# Patient Record
Sex: Female | Born: 1950 | Race: White | Hispanic: No | Marital: Married | Smoking: Never smoker
Health system: Southern US, Community
[De-identification: ages and names within clinical notes are randomized; demographics above are authoritative.]

## PROBLEM LIST (undated history)

## (undated) ENCOUNTER — Emergency Department (HOSPITAL_COMMUNITY): Admission: EM | Payer: Medicare Other | Source: Home / Self Care

## (undated) DIAGNOSIS — F419 Anxiety disorder, unspecified: Secondary | ICD-10-CM

## (undated) DIAGNOSIS — M5030 Other cervical disc degeneration, unspecified cervical region: Secondary | ICD-10-CM

## (undated) DIAGNOSIS — J45909 Unspecified asthma, uncomplicated: Secondary | ICD-10-CM

## (undated) DIAGNOSIS — F329 Major depressive disorder, single episode, unspecified: Secondary | ICD-10-CM

## (undated) DIAGNOSIS — Z8614 Personal history of Methicillin resistant Staphylococcus aureus infection: Secondary | ICD-10-CM

## (undated) DIAGNOSIS — IMO0002 Reserved for concepts with insufficient information to code with codable children: Secondary | ICD-10-CM

## (undated) DIAGNOSIS — I219 Acute myocardial infarction, unspecified: Secondary | ICD-10-CM

## (undated) DIAGNOSIS — M797 Fibromyalgia: Secondary | ICD-10-CM

## (undated) DIAGNOSIS — M329 Systemic lupus erythematosus, unspecified: Secondary | ICD-10-CM

## (undated) DIAGNOSIS — K219 Gastro-esophageal reflux disease without esophagitis: Secondary | ICD-10-CM

## (undated) DIAGNOSIS — F32A Depression, unspecified: Secondary | ICD-10-CM

## (undated) DIAGNOSIS — M51369 Other intervertebral disc degeneration, lumbar region without mention of lumbar back pain or lower extremity pain: Secondary | ICD-10-CM

## (undated) DIAGNOSIS — M5136 Other intervertebral disc degeneration, lumbar region: Secondary | ICD-10-CM

## (undated) DIAGNOSIS — Z9884 Bariatric surgery status: Secondary | ICD-10-CM

## (undated) DIAGNOSIS — Z973 Presence of spectacles and contact lenses: Secondary | ICD-10-CM

## (undated) DIAGNOSIS — I82409 Acute embolism and thrombosis of unspecified deep veins of unspecified lower extremity: Secondary | ICD-10-CM

## (undated) DIAGNOSIS — S83272A Complex tear of lateral meniscus, current injury, left knee, initial encounter: Secondary | ICD-10-CM

## (undated) DIAGNOSIS — E039 Hypothyroidism, unspecified: Secondary | ICD-10-CM

## (undated) DIAGNOSIS — I10 Essential (primary) hypertension: Secondary | ICD-10-CM

## (undated) DIAGNOSIS — D759 Disease of blood and blood-forming organs, unspecified: Secondary | ICD-10-CM

## (undated) DIAGNOSIS — M1990 Unspecified osteoarthritis, unspecified site: Secondary | ICD-10-CM

## (undated) DIAGNOSIS — Z8719 Personal history of other diseases of the digestive system: Secondary | ICD-10-CM

## (undated) DIAGNOSIS — I499 Cardiac arrhythmia, unspecified: Secondary | ICD-10-CM

## (undated) DIAGNOSIS — R0602 Shortness of breath: Secondary | ICD-10-CM

## (undated) DIAGNOSIS — S0990XA Unspecified injury of head, initial encounter: Secondary | ICD-10-CM

## (undated) DIAGNOSIS — H9190 Unspecified hearing loss, unspecified ear: Secondary | ICD-10-CM

## (undated) DIAGNOSIS — L0291 Cutaneous abscess, unspecified: Secondary | ICD-10-CM

## (undated) DIAGNOSIS — I2510 Atherosclerotic heart disease of native coronary artery without angina pectoris: Secondary | ICD-10-CM

## (undated) DIAGNOSIS — R51 Headache: Secondary | ICD-10-CM

## (undated) DIAGNOSIS — M419 Scoliosis, unspecified: Secondary | ICD-10-CM

## (undated) HISTORY — DX: Complex tear of lateral meniscus, current injury, left knee, initial encounter: S83.272A

## (undated) HISTORY — DX: Personal history of Methicillin resistant Staphylococcus aureus infection: Z86.14

---
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
## 1984-04-09 HISTORY — PX: ABDOMINAL HYSTERECTOMY: SHX81

## 1998-07-21 ENCOUNTER — Encounter: Admission: RE | Admit: 1998-07-21 | Discharge: 1998-10-19 | Payer: Self-pay | Admitting: Internal Medicine

## 2008-04-09 HISTORY — PX: CARDIAC CATHETERIZATION: SHX172

## 2008-04-09 HISTORY — PX: GASTRIC BYPASS: SHX52

## 2010-08-08 ENCOUNTER — Encounter: Payer: Self-pay | Admitting: Cardiology

## 2010-09-22 ENCOUNTER — Encounter: Payer: Self-pay | Admitting: Cardiology

## 2010-12-19 ENCOUNTER — Telehealth: Payer: Self-pay | Admitting: Cardiology

## 2010-12-19 NOTE — Telephone Encounter (Signed)
Pt's orthopedic surgeon will be doing knee surgery, will not do it until she has a filter placed in her artery, she said no one will touch her but stuckey, she has not been seen in 12 yrs, said to tell him she is robert walker's granddaughter, pls call

## 2010-12-19 NOTE — Telephone Encounter (Signed)
Pt has a tear in her left knee that needs to be repaired.  She states she has factors 5,9,11 and 13.  Her orthopedic physician told her that she needs to have a filter put in before they can operate.  Pt is requesting Dr Riley Kill for this as he has treated her in the past as well as many members of her family.  The date for the surgery cannot be scheduled until she is cleared by Dr Riley Kill.

## 2011-01-01 ENCOUNTER — Telehealth: Payer: Self-pay | Admitting: Cardiology

## 2011-01-01 NOTE — Telephone Encounter (Signed)
I spoke with the pt and made her aware that we are pulling her old chart from warehouse.  I recommended that the pt obtain her records from [facility] and [facility].  Will await records before obtaining appointment. Dr Riley Kill would like to review records.   Pt's dad Jeronimo Greaves and grandfather Kym Groom

## 2011-01-01 NOTE — Telephone Encounter (Signed)
Please call about clotting problems and procedure she needs

## 2011-01-04 ENCOUNTER — Telehealth: Payer: Self-pay | Admitting: Cardiology

## 2011-01-04 NOTE — Telephone Encounter (Signed)
I spoke with the pt and she is coming into the office tomorrow to sign a release of information for records.

## 2011-01-04 NOTE — Telephone Encounter (Signed)
I will close this encounter.  The pt has a new telephone note from 01/04/11.

## 2011-01-04 NOTE — Telephone Encounter (Signed)
I spoke with our medical records department and they are once again trying to find this pt's old chart for Dr Riley Kill to review.  I called the pt but her husband said that she is currently in the store.  I will call the pt back.   Melanie Gutting, RN 01/01/2011 5:07 PM Signed  I spoke with the pt and made her aware that we are pulling her old chart from warehouse. I recommended that the pt obtain her records from [facility] and [facility]. Will await records before obtaining appointment. Dr Riley Kill would like to review records.  Pt's dad Jeronimo Greaves and grandfather Kym Groom

## 2011-01-04 NOTE — Telephone Encounter (Signed)
Please see other telephone notes since this message.

## 2011-01-04 NOTE — Telephone Encounter (Signed)
Pt is calling about records [facility], [facility] and [facility] please call

## 2011-01-30 ENCOUNTER — Telehealth: Payer: Self-pay | Admitting: Cardiology

## 2011-01-30 NOTE — Telephone Encounter (Signed)
Left a message to call back.

## 2011-01-30 NOTE — Telephone Encounter (Signed)
Pt returning call to nurse regarding getting some info from other two hospitals where pt has had heart attacks. Md will not approve knee surgery until Dr. Riley Kill get back with pt. Please return call to discuss further.

## 2011-02-23 NOTE — Telephone Encounter (Signed)
I spoke with the pt and made her aware that Dr Riley Kill recommended that she see her Hematologist first for evaluation in preparation for surgery.  The pt said that she is scheduled to go to the [facility] in January.  The pt said she will see GI, Hematology and have a Cardiology work-up at that time.  The pt's last cardiac cath took place at the [facility].  After this evaluation the pt will contact Dr Sherene Sires office if she is cleared from a cardiac standpoint by the [facility].  When the pt schedules her knee surgery with Dr Thurston Hole he would like Dr Riley Kill to be available if needed.  I made the pt aware that Dr Riley Kill will need to then see her in the office to put "all the pieces together" so that he is familiar with her history before she has surgery.  The pt agrees with plan and will contact our office after her evaluation at the [facility].

## 2011-07-27 ENCOUNTER — Encounter: Payer: Self-pay | Admitting: Cardiology

## 2011-08-30 ENCOUNTER — Encounter: Payer: Self-pay | Admitting: Cardiology

## 2012-08-08 ENCOUNTER — Encounter (HOSPITAL_BASED_OUTPATIENT_CLINIC_OR_DEPARTMENT_OTHER): Payer: Self-pay | Admitting: *Deleted

## 2012-08-08 NOTE — Progress Notes (Signed)
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

## 2012-08-11 ENCOUNTER — Encounter (HOSPITAL_BASED_OUTPATIENT_CLINIC_OR_DEPARTMENT_OTHER): Payer: Self-pay | Admitting: *Deleted

## 2012-08-11 NOTE — Progress Notes (Signed)
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  At about 2 pm dr Thurston Hole talked with dr Jean Rosenthal and pt put back on sch here for tomorrow-pt will do what dr Rudene Christians will bring all her meds and keep extra lovenox will bring. Will need istat and ekg-has not seen her cardiologist [facility] in 1 yr.

## 2012-08-12 ENCOUNTER — Encounter (HOSPITAL_BASED_OUTPATIENT_CLINIC_OR_DEPARTMENT_OTHER)
Admission: RE | Disposition: A | Discharge status: Home / Self Care | Payer: Self-pay | Source: Ambulatory Visit | Attending: Orthopedic Surgery

## 2012-08-12 ENCOUNTER — Encounter (HOSPITAL_BASED_OUTPATIENT_CLINIC_OR_DEPARTMENT_OTHER): Payer: Self-pay | Admitting: Anesthesiology

## 2012-08-12 ENCOUNTER — Ambulatory Visit (HOSPITAL_BASED_OUTPATIENT_CLINIC_OR_DEPARTMENT_OTHER): Admission: RE | Admit: 2012-08-12 | Payer: Medicare Other | Source: Ambulatory Visit | Admitting: Orthopedic Surgery

## 2012-08-12 ENCOUNTER — Encounter (HOSPITAL_BASED_OUTPATIENT_CLINIC_OR_DEPARTMENT_OTHER): Admission: RE | Payer: Self-pay | Source: Ambulatory Visit

## 2012-08-12 ENCOUNTER — Ambulatory Visit (HOSPITAL_BASED_OUTPATIENT_CLINIC_OR_DEPARTMENT_OTHER)
Admission: RE | Admit: 2012-08-12 | Discharge: 2012-08-12 | Disposition: A | Discharge status: Home / Self Care | Payer: Medicare Other | Source: Ambulatory Visit | Attending: Orthopedic Surgery | Admitting: Orthopedic Surgery

## 2012-08-12 ENCOUNTER — Ambulatory Visit (HOSPITAL_BASED_OUTPATIENT_CLINIC_OR_DEPARTMENT_OTHER): Payer: Medicare Other | Admitting: Anesthesiology

## 2012-08-12 ENCOUNTER — Encounter: Payer: Self-pay | Admitting: Physician Assistant

## 2012-08-12 ENCOUNTER — Other Ambulatory Visit: Payer: Self-pay | Admitting: Physician Assistant

## 2012-08-12 DIAGNOSIS — H9190 Unspecified hearing loss, unspecified ear: Secondary | ICD-10-CM | POA: Insufficient documentation

## 2012-08-12 DIAGNOSIS — I2510 Atherosclerotic heart disease of native coronary artery without angina pectoris: Secondary | ICD-10-CM | POA: Insufficient documentation

## 2012-08-12 DIAGNOSIS — Z9884 Bariatric surgery status: Secondary | ICD-10-CM | POA: Insufficient documentation

## 2012-08-12 DIAGNOSIS — M51379 Other intervertebral disc degeneration, lumbosacral region without mention of lumbar back pain or lower extremity pain: Secondary | ICD-10-CM | POA: Insufficient documentation

## 2012-08-12 DIAGNOSIS — M5030 Other cervical disc degeneration, unspecified cervical region: Secondary | ICD-10-CM | POA: Insufficient documentation

## 2012-08-12 DIAGNOSIS — M5136 Other intervertebral disc degeneration, lumbar region: Secondary | ICD-10-CM | POA: Insufficient documentation

## 2012-08-12 DIAGNOSIS — Z881 Allergy status to other antibiotic agents status: Secondary | ICD-10-CM | POA: Insufficient documentation

## 2012-08-12 DIAGNOSIS — IMO0002 Reserved for concepts with insufficient information to code with codable children: Secondary | ICD-10-CM | POA: Insufficient documentation

## 2012-08-12 DIAGNOSIS — M1990 Unspecified osteoarthritis, unspecified site: Secondary | ICD-10-CM | POA: Insufficient documentation

## 2012-08-12 DIAGNOSIS — D689 Coagulation defect, unspecified: Secondary | ICD-10-CM | POA: Insufficient documentation

## 2012-08-12 DIAGNOSIS — R29898 Other symptoms and signs involving the musculoskeletal system: Secondary | ICD-10-CM | POA: Insufficient documentation

## 2012-08-12 DIAGNOSIS — Z79899 Other long term (current) drug therapy: Secondary | ICD-10-CM | POA: Insufficient documentation

## 2012-08-12 DIAGNOSIS — I498 Other specified cardiac arrhythmias: Secondary | ICD-10-CM | POA: Insufficient documentation

## 2012-08-12 DIAGNOSIS — S0990XA Unspecified injury of head, initial encounter: Secondary | ICD-10-CM | POA: Insufficient documentation

## 2012-08-12 DIAGNOSIS — M419 Scoliosis, unspecified: Secondary | ICD-10-CM | POA: Insufficient documentation

## 2012-08-12 DIAGNOSIS — Z8614 Personal history of Methicillin resistant Staphylococcus aureus infection: Secondary | ICD-10-CM | POA: Insufficient documentation

## 2012-08-12 DIAGNOSIS — S83272A Complex tear of lateral meniscus, current injury, left knee, initial encounter: Secondary | ICD-10-CM | POA: Insufficient documentation

## 2012-08-12 DIAGNOSIS — S83272D Complex tear of lateral meniscus, current injury, left knee, subsequent encounter: Secondary | ICD-10-CM

## 2012-08-12 DIAGNOSIS — Z886 Allergy status to analgesic agent status: Secondary | ICD-10-CM | POA: Insufficient documentation

## 2012-08-12 DIAGNOSIS — Z973 Presence of spectacles and contact lenses: Secondary | ICD-10-CM | POA: Insufficient documentation

## 2012-08-12 DIAGNOSIS — F329 Major depressive disorder, single episode, unspecified: Secondary | ICD-10-CM | POA: Insufficient documentation

## 2012-08-12 DIAGNOSIS — E039 Hypothyroidism, unspecified: Secondary | ICD-10-CM | POA: Insufficient documentation

## 2012-08-12 DIAGNOSIS — M5137 Other intervertebral disc degeneration, lumbosacral region: Secondary | ICD-10-CM | POA: Insufficient documentation

## 2012-08-12 DIAGNOSIS — M329 Systemic lupus erythematosus, unspecified: Secondary | ICD-10-CM | POA: Insufficient documentation

## 2012-08-12 DIAGNOSIS — I499 Cardiac arrhythmia, unspecified: Secondary | ICD-10-CM | POA: Insufficient documentation

## 2012-08-12 DIAGNOSIS — R51 Headache: Secondary | ICD-10-CM | POA: Insufficient documentation

## 2012-08-12 DIAGNOSIS — I10 Essential (primary) hypertension: Secondary | ICD-10-CM | POA: Insufficient documentation

## 2012-08-12 DIAGNOSIS — X58XXXA Exposure to other specified factors, initial encounter: Secondary | ICD-10-CM | POA: Insufficient documentation

## 2012-08-12 DIAGNOSIS — K219 Gastro-esophageal reflux disease without esophagitis: Secondary | ICD-10-CM | POA: Insufficient documentation

## 2012-08-12 DIAGNOSIS — Z888 Allergy status to other drugs, medicaments and biological substances status: Secondary | ICD-10-CM | POA: Insufficient documentation

## 2012-08-12 DIAGNOSIS — L0291 Cutaneous abscess, unspecified: Secondary | ICD-10-CM | POA: Insufficient documentation

## 2012-08-12 DIAGNOSIS — I252 Old myocardial infarction: Secondary | ICD-10-CM | POA: Insufficient documentation

## 2012-08-12 DIAGNOSIS — Z8719 Personal history of other diseases of the digestive system: Secondary | ICD-10-CM | POA: Insufficient documentation

## 2012-08-12 DIAGNOSIS — I219 Acute myocardial infarction, unspecified: Secondary | ICD-10-CM | POA: Insufficient documentation

## 2012-08-12 DIAGNOSIS — J45909 Unspecified asthma, uncomplicated: Secondary | ICD-10-CM | POA: Insufficient documentation

## 2012-08-12 DIAGNOSIS — M2240 Chondromalacia patellae, unspecified knee: Secondary | ICD-10-CM | POA: Insufficient documentation

## 2012-08-12 DIAGNOSIS — R519 Headache, unspecified: Secondary | ICD-10-CM | POA: Insufficient documentation

## 2012-08-12 DIAGNOSIS — S83289A Other tear of lateral meniscus, current injury, unspecified knee, initial encounter: Secondary | ICD-10-CM | POA: Insufficient documentation

## 2012-08-12 DIAGNOSIS — I82409 Acute embolism and thrombosis of unspecified deep veins of unspecified lower extremity: Secondary | ICD-10-CM | POA: Insufficient documentation

## 2012-08-12 DIAGNOSIS — IMO0001 Reserved for inherently not codable concepts without codable children: Secondary | ICD-10-CM | POA: Insufficient documentation

## 2012-08-12 DIAGNOSIS — D759 Disease of blood and blood-forming organs, unspecified: Secondary | ICD-10-CM | POA: Insufficient documentation

## 2012-08-12 DIAGNOSIS — M797 Fibromyalgia: Secondary | ICD-10-CM | POA: Insufficient documentation

## 2012-08-12 HISTORY — PX: KNEE ARTHROSCOPY: SHX127

## 2012-08-12 HISTORY — DX: Unspecified hearing loss, unspecified ear: H91.90

## 2012-08-12 HISTORY — DX: Scoliosis, unspecified: M41.9

## 2012-08-12 HISTORY — DX: Personal history of other diseases of the digestive system: Z87.19

## 2012-08-12 HISTORY — DX: Reserved for concepts with insufficient information to code with codable children: IMO0002

## 2012-08-12 HISTORY — DX: Major depressive disorder, single episode, unspecified: F32.9

## 2012-08-12 HISTORY — DX: Disease of blood and blood-forming organs, unspecified: D75.9

## 2012-08-12 HISTORY — DX: Unspecified injury of head, initial encounter: S09.90XA

## 2012-08-12 HISTORY — DX: Cutaneous abscess, unspecified: L02.91

## 2012-08-12 HISTORY — DX: Anxiety disorder, unspecified: F41.9

## 2012-08-12 HISTORY — DX: Acute embolism and thrombosis of unspecified deep veins of unspecified lower extremity: I82.409

## 2012-08-12 HISTORY — DX: Essential (primary) hypertension: I10

## 2012-08-12 HISTORY — DX: Shortness of breath: R06.02

## 2012-08-12 HISTORY — DX: Unspecified asthma, uncomplicated: J45.909

## 2012-08-12 HISTORY — DX: Depression, unspecified: F32.A

## 2012-08-12 HISTORY — DX: Bariatric surgery status: Z98.84

## 2012-08-12 HISTORY — DX: Cardiac arrhythmia, unspecified: I49.9

## 2012-08-12 HISTORY — DX: Systemic lupus erythematosus, unspecified: M32.9

## 2012-08-12 HISTORY — DX: Other intervertebral disc degeneration, lumbar region without mention of lumbar back pain or lower extremity pain: M51.369

## 2012-08-12 HISTORY — DX: Atherosclerotic heart disease of native coronary artery without angina pectoris: I25.10

## 2012-08-12 HISTORY — DX: Other intervertebral disc degeneration, lumbar region: M51.36

## 2012-08-12 HISTORY — DX: Presence of spectacles and contact lenses: Z97.3

## 2012-08-12 HISTORY — DX: Other cervical disc degeneration, unspecified cervical region: M50.30

## 2012-08-12 HISTORY — DX: Fibromyalgia: M79.7

## 2012-08-12 HISTORY — DX: Unspecified osteoarthritis, unspecified site: M19.90

## 2012-08-12 HISTORY — DX: Gastro-esophageal reflux disease without esophagitis: K21.9

## 2012-08-12 HISTORY — DX: Headache: R51

## 2012-08-12 HISTORY — DX: Hypothyroidism, unspecified: E03.9

## 2012-08-12 HISTORY — DX: Acute myocardial infarction, unspecified: I21.9

## 2012-08-12 LAB — POCT I-STAT, CHEM 8
BUN: 6 mg/dL (ref 6–23)
Calcium, Ion: 1.32 mmol/L — ABNORMAL HIGH (ref 1.13–1.30)
Chloride: 105 mEq/L (ref 96–112)
HCT: 44 % (ref 36.0–46.0)
Sodium: 144 mEq/L (ref 135–145)

## 2012-08-12 SURGERY — ARTHROSCOPY, KNEE
Anesthesia: General | Site: Knee | Laterality: Left | Wound class: Clean

## 2012-08-12 SURGERY — ARTHROSCOPY, KNEE, WITH MEDIAL MENISCECTOMY
Anesthesia: Choice | Site: Knee | Laterality: Left

## 2012-08-12 MED ORDER — OXYCODONE-ACETAMINOPHEN 5-325 MG PO TABS: ORAL_TABLET | ORAL | Status: DC

## 2012-08-12 MED ORDER — ENOXAPARIN SODIUM 30 MG/0.3ML ~~LOC~~ SOLN: 30.0000 mg | Freq: Two times a day (BID) | SUBCUTANEOUS | Status: DC

## 2012-08-12 MED ORDER — OXYCODONE HCL 5 MG/5ML PO SOLN: 5.0000 mg | Freq: Once | ORAL | Status: AC | PRN

## 2012-08-12 MED ORDER — FENTANYL CITRATE 0.05 MG/ML IJ SOLN
25.0000 ug | INTRAMUSCULAR | Status: DC | PRN
  Administered 2012-08-12: 50 ug via INTRAVENOUS
  Administered 2012-08-12: 25 ug via INTRAVENOUS

## 2012-08-12 MED ORDER — PROPOFOL 10 MG/ML IV BOLUS
INTRAVENOUS | Status: DC | PRN
  Administered 2012-08-12: 200 mg via INTRAVENOUS

## 2012-08-12 MED ORDER — OXYCODONE HCL 5 MG PO TABS
5.0000 mg | ORAL_TABLET | Freq: Once | ORAL | Status: AC | PRN
  Administered 2012-08-12: 5 mg via ORAL

## 2012-08-12 MED ORDER — LIDOCAINE HCL (CARDIAC) 20 MG/ML IV SOLN
INTRAVENOUS | Status: DC | PRN
  Administered 2012-08-12: 50 mg via INTRAVENOUS

## 2012-08-12 MED ORDER — CHLORHEXIDINE GLUCONATE 4 % EX LIQD: 60.0000 mL | Freq: Once | CUTANEOUS | Status: DC

## 2012-08-12 MED ORDER — FENTANYL CITRATE 0.05 MG/ML IJ SOLN
50.0000 ug | INTRAMUSCULAR | Status: DC | PRN
  Administered 2012-08-12: 100 ug via INTRAVENOUS

## 2012-08-12 MED ORDER — DEXAMETHASONE SODIUM PHOSPHATE 4 MG/ML IJ SOLN
INTRAMUSCULAR | Status: DC | PRN
  Administered 2012-08-12: 10 mg via INTRAVENOUS

## 2012-08-12 MED ORDER — LIDOCAINE-EPINEPHRINE (PF) 1.5 %-1:200000 IJ SOLN
INTRAMUSCULAR | Status: DC | PRN
  Administered 2012-08-12: 20 mL

## 2012-08-12 MED ORDER — FENTANYL CITRATE 0.05 MG/ML IJ SOLN
INTRAMUSCULAR | Status: DC | PRN
  Administered 2012-08-12: 100 ug via INTRAVENOUS

## 2012-08-12 MED ORDER — SODIUM CHLORIDE 0.9 % IR SOLN
Status: DC | PRN
  Administered 2012-08-12: 13:00:00

## 2012-08-12 MED ORDER — LACTATED RINGERS IV SOLN
INTRAVENOUS | Status: DC
  Administered 2012-08-12: 12:00:00 via INTRAVENOUS

## 2012-08-12 MED ORDER — CEFAZOLIN SODIUM-DEXTROSE 2-3 GM-% IV SOLR: 2.0000 g | INTRAVENOUS | Status: DC

## 2012-08-12 MED ORDER — VANCOMYCIN HCL IN DEXTROSE 1-5 GM/200ML-% IV SOLN
1000.0000 mg | INTRAVENOUS | Status: AC
  Administered 2012-08-12: 1000 mg via INTRAVENOUS

## 2012-08-12 MED ORDER — ONDANSETRON HCL 4 MG/2ML IJ SOLN
INTRAMUSCULAR | Status: DC | PRN
  Administered 2012-08-12: 4 mg via INTRAVENOUS

## 2012-08-12 MED ORDER — BUPIVACAINE HCL (PF) 0.25 % IJ SOLN
INTRAMUSCULAR | Status: DC | PRN
  Administered 2012-08-12: 16 mL

## 2012-08-12 MED ORDER — MIDAZOLAM HCL 5 MG/5ML IJ SOLN
INTRAMUSCULAR | Status: DC | PRN
  Administered 2012-08-12: 2 mg via INTRAVENOUS

## 2012-08-12 MED ORDER — MIDAZOLAM HCL 2 MG/2ML IJ SOLN
1.0000 mg | INTRAMUSCULAR | Status: DC | PRN
  Administered 2012-08-12: 2 mg via INTRAVENOUS

## 2012-08-12 MED ORDER — METOCLOPRAMIDE HCL 5 MG/ML IJ SOLN: 10.0000 mg | Freq: Once | INTRAMUSCULAR | Status: DC | PRN

## 2012-08-12 SURGICAL SUPPLY — 57 items
APL SKNCLS STERI-STRIP NONHPOA (GAUZE/BANDAGES/DRESSINGS)
BANDAGE ELASTIC 6 VELCRO ST LF (GAUZE/BANDAGES/DRESSINGS) ×2 IMPLANT
BANDAGE ESMARK 6X9 LF (GAUZE/BANDAGES/DRESSINGS) IMPLANT
BENZOIN TINCTURE PRP APPL 2/3 (GAUZE/BANDAGES/DRESSINGS) IMPLANT
BLADE CUTTER GATOR 3.5 (BLADE) ×1 IMPLANT
BLADE GREAT WHITE 4.2 (BLADE) ×1 IMPLANT
BLADE SURG 15 STRL LF DISP TIS (BLADE) IMPLANT
BLADE SURG 15 STRL SS (BLADE)
BNDG CMPR 9X6 STRL LF SNTH (GAUZE/BANDAGES/DRESSINGS)
BNDG COHESIVE 4X5 TAN STRL (GAUZE/BANDAGES/DRESSINGS) IMPLANT
BNDG ESMARK 6X9 LF (GAUZE/BANDAGES/DRESSINGS)
CANISTER OMNI JUG 16 LITER (MISCELLANEOUS) ×1 IMPLANT
CANISTER SUCTION 2500CC (MISCELLANEOUS) IMPLANT
DRAPE ARTHROSCOPY W/POUCH 90 (DRAPES) ×2 IMPLANT
DURAPREP 26ML APPLICATOR (WOUND CARE) ×2 IMPLANT
GAUZE XEROFORM 1X8 LF (GAUZE/BANDAGES/DRESSINGS) ×2 IMPLANT
GLOVE BIO SURGEON STRL SZ7 (GLOVE) ×1 IMPLANT
GLOVE BIOGEL PI IND STRL 7.0 (GLOVE) ×1 IMPLANT
GLOVE BIOGEL PI IND STRL 7.5 (GLOVE) ×1 IMPLANT
GLOVE BIOGEL PI INDICATOR 7.0 (GLOVE) ×2
GLOVE BIOGEL PI INDICATOR 7.5 (GLOVE) ×1
GLOVE SKINSENSE NS SZ7.5 (GLOVE) ×1
GLOVE SKINSENSE STRL SZ7.5 (GLOVE) ×1 IMPLANT
GLOVE SS BIOGEL STRL SZ 7.5 (GLOVE) ×1 IMPLANT
GLOVE SUPERSENSE BIOGEL SZ 7.5 (GLOVE)
GOWN PREVENTION PLUS XLARGE (GOWN DISPOSABLE) ×3 IMPLANT
HOLDER KNEE FOAM BLUE (MISCELLANEOUS) ×2 IMPLANT
KNEE WRAP E Z 3 GEL PACK (MISCELLANEOUS) ×2 IMPLANT
NDL SAFETY ECLIPSE 18X1.5 (NEEDLE) ×2 IMPLANT
NEEDLE HYPO 18GX1.5 SHARP (NEEDLE) ×2
NEEDLE HYPO 22GX1.5 SAFETY (NEEDLE) IMPLANT
PACK ARTHROSCOPY DSU (CUSTOM PROCEDURE TRAY) ×2 IMPLANT
PACK BASIN DAY SURGERY FS (CUSTOM PROCEDURE TRAY) ×2 IMPLANT
PAD ALCOHOL SWAB (MISCELLANEOUS) ×1 IMPLANT
SET ARTHROSCOPY TUBING (MISCELLANEOUS) ×2
SET ARTHROSCOPY TUBING LN (MISCELLANEOUS) ×1 IMPLANT
SKINSENSE 6.5 ×1 IMPLANT
SKINSENSE 7.0 ×1 IMPLANT
SPONGE GAUZE 4X4 12PLY (GAUZE/BANDAGES/DRESSINGS) ×2 IMPLANT
STRIP CLOSURE SKIN 1/2X4 (GAUZE/BANDAGES/DRESSINGS) IMPLANT
SUCTION FRAZIER TIP 10 FR DISP (SUCTIONS) IMPLANT
SUT ETHILON 4 0 PS 2 18 (SUTURE) ×2 IMPLANT
SUT FIBERWIRE #2 38 T-5 BLUE (SUTURE)
SUT PDS AB 0 CT 36 (SUTURE) IMPLANT
SUT PROLENE 3 0 PS 2 (SUTURE) IMPLANT
SUT VIC AB 0 CT1 18XCR BRD 8 (SUTURE) IMPLANT
SUT VIC AB 0 CT1 8-18 (SUTURE)
SUT VIC AB 2-0 CT1 27 (SUTURE)
SUT VIC AB 2-0 CT1 TAPERPNT 27 (SUTURE) IMPLANT
SUT VIC AB 3-0 PS1 18 (SUTURE)
SUT VIC AB 3-0 PS1 18XBRD (SUTURE) IMPLANT
SUTURE FIBERWR #2 38 T-5 BLUE (SUTURE) IMPLANT
SYR 20CC LL (SYRINGE) IMPLANT
SYR 5ML LL (SYRINGE) ×2 IMPLANT
TOWEL OR 17X24 6PK STRL BLUE (TOWEL DISPOSABLE) ×2 IMPLANT
WAND STAR VAC 90 (SURGICAL WAND) ×1 IMPLANT
WATER STERILE IRR 1000ML POUR (IV SOLUTION) ×2 IMPLANT

## 2012-08-12 NOTE — Progress Notes (Signed)
Assisted Dr. Frederick with left, knee block. Side rails up, monitors on throughout procedure. See vital signs in flow sheet. Tolerated Procedure well. 

## 2012-08-12 NOTE — Transfer of Care (Signed)
Immediate Anesthesia Transfer of Care Note  Patient: Melanie Jarvis  Procedure(s) Performed: Procedure(s): ARTHROSCOPY KNEE, PARTIAL LATERAL MENISCECTOMY, CHONDROPLASTY (Left)  Patient Location: PACU  Anesthesia Type:General  Level of Consciousness: sedated  Airway & Oxygen Therapy: Patient Spontanous Breathing and Patient connected to face mask oxygen  Post-op Assessment: Report given to PACU RN and Post -op Vital signs reviewed and stable  Post vital signs: Reviewed and stable  Complications: No apparent anesthesia complications

## 2012-08-12 NOTE — H&P (View-Only) (Signed)
Melanie Jarvis is an 62 y.o. female.   Chief Complaint: left knee lateral meniscus tear HPI: Melanie Jarvis is a 61 year old seen for follow-up for significant left knee pain increasing in nature. We saw her on 10/04/11 for significant left knee pain with meniscal tearing. At that time we discussed left knee arthroscopy. She was previously seen at the [facility] by cardiology and was cleared for surgery but she decided to put this off. She now wants to proceed with left knee arthroscopy. She had a left knee MRI ordered by the military physicians done on 06/02/12 and revealed a complex tear of the lateral meniscus with adjacent parameniscal cyst. She also had a right knee MRI that showed a complex tear of the anterior horn of the medial meniscus. Her right knee is not bothering her as much as the left knee.   Past Medical History  Diagnosis Date  . Coronary artery disease   . Hypertension   . Myocardial infarction   . Dysrhythmia   . Hypothyroidism   . Anxiety   . Depression   . Shortness of breath   . GERD (gastroesophageal reflux disease)   . H/O hiatal hernia   . Headache   . Arthritis   . Fibromyalgia   . 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 Wears glasses   . HOH (hard of hearing)   . DVT (deep venous thrombosis)     multiple since age 16  . MI (myocardial infarction)     due to clots  . H/O gastric bypass     only has 20%stomack-no nsaids-gi bleed  . Asthma     SEVERE  . Head injury     years ago-memory loss  . Scoliosis   . DDD (degenerative disc disease), cervical   . DDD (degenerative disc disease), lumbar   . HNP (herniated nucleus pulposus)     multiple  . Lupus   . Abscess     chronic abscess chest comes and goes and has test positive for MRSA-steroids triggers it  . Complex tear of lateral meniscus of left knee as current injury   . Hx MRSA infection     Past Surgical History  Procedure Laterality Date  . Abdominal hysterectomy  1986  .  Appendectomy  1964  . Gastric bypass  2010    done at the [facility]  . Foot neuroma surgery  1971    rt foot  . Cardiac catheterization  2010    Family History  Problem Relation Age of Onset  . Diabetes    . Hypertension    . Heart disease    . Arthritis     Social History:  reports that she has never smoked. She does not have any smokeless tobacco history on file. She reports that she does not drink alcohol or use illicit drugs.  Allergies:  Allergies  Allergen Reactions  . Antihistamines, Chlorpheniramine-Type Shortness Of Breath    asthma  . Beta Adrenergic Blockers Shortness Of Breath    asthma  . Erythromycin Anaphylaxis  . Tetanus Toxoids Anaphylaxis  . Asa (Aspirin)     No po asa-gi bleed  . Xarelto (Rivaroxaban) Swelling    Mouth blisters   Current Outpatient Prescriptions on File Prior to Visit  Medication Sig Dispense Refill  . albuterol (PROVENTIL HFA;VENTOLIN HFA) 108 (90 BASE) MCG/ACT inhaler Inhale 2 puffs into the lungs every 4 (four) hours as needed for wheezing.      .   albuterol (PROVENTIL) (2.5 MG/3ML) 0.083% nebulizer solution Take 2.5 mg by nebulization every 6 (six) hours as needed for wheezing.      . ALPRAZolam (XANAX) 1 MG tablet Take 1 mg by mouth 3 (three) times daily as needed for sleep.      . benazepril (LOTENSIN) 20 MG tablet Take 20 mg by mouth daily.      . cyclobenzaprine (FLEXERIL) 10 MG tablet Take 10 mg by mouth 2 (two) times daily as needed for muscle spasms.      . famotidine (PEPCID) 20 MG tablet Take 20 mg by mouth 2 (two) times daily.      . fluticasone (FLONASE) 50 MCG/ACT nasal spray Place 2 sprays into the nose daily.      . furosemide (LASIX) 40 MG tablet Take 40 mg by mouth daily.      . HYDROcodone-acetaminophen (NORCO) 10-325 MG per tablet Take 1 tablet by mouth every 6 (six) hours as needed for pain.      . lansoprazole (PREVACID) 30 MG capsule Take 30 mg by mouth 2 (two) times daily. Takes 2 twice a day      .  levothyroxine (SYNTHROID, LEVOTHROID) 88 MCG tablet Take 88 mcg by mouth daily before breakfast.      . potassium chloride 20 MEQ/15ML (10%) solution Take 10 mEq by mouth daily.      . pseudoephedrine (SUDAFED) 30 MG tablet Take 30 mg by mouth every 4 (four) hours as needed for congestion.      . ursodiol (ACTIGALL) 300 MG capsule Take 300 mg by mouth 2 (two) times daily.      . vitamin C (ASCORBIC ACID) 500 MG tablet Take 500 mg by mouth daily.       No current facility-administered medications on file prior to visit.      (Not in a hospital admission)  No results found for this or any previous visit (from the past 48 hour(s)). No results found.  Review of Systems  Constitutional: Negative.   HENT: Negative.   Eyes: Negative.   Respiratory: Negative.   Cardiovascular: Negative.   Gastrointestinal: Negative.   Genitourinary: Negative.   Musculoskeletal: Positive for joint pain.       Left knee  Skin: Negative.   Neurological: Negative.   Endo/Heme/Allergies: Negative.   Psychiatric/Behavioral: Negative.     There were no vitals taken for this visit. Physical Exam  Constitutional: She is oriented to person, place, and time. She appears well-developed and well-nourished.  HENT:  Head: Normocephalic.  Eyes: EOM are normal. Pupils are equal, round, and reactive to light.  Neck: Neck supple.  Cardiovascular: Normal rate.   Respiratory: Effort normal.  GI: Soft.  Genitourinary:  Not pertinent to current symptomatology therefore not examined.  Musculoskeletal:  Examination of her left knee reveals pain medially and laterally 1+ synovitis full range of motion positive lateral McMurray's knee stable to ligamentous exam with normal patella tracking. Exam of the right knee reveals full range of motion without pain swelling weakness or instability. Vascular exam: pulses 2+ and symmetric.   Neurological: She is alert and oriented to person, place, and time.  Skin: Skin is warm and  dry.  Psychiatric: She has a normal mood and affect.    X-RAYS: X-rays of both knees standing AP lateral flexion and sunrise views show minimal degenerative changes. No acute bony or joint abnormality.   Assessment Left knee lateral meniscus tear with history of septic bursitis and lateral meniscal cyst. Right knee lateral   meniscus tear. History of DVT - factor V deficiency. Lupus. History of MI with negative cardiac workup. Status post gastric bypass.  Plan At this point I do think she has failed conservative treatment for her left knee and would recommend left knee arthroscopy. She will need to be treated with anticoagulation post-op. Discussed risks benefits and possible complications of the surgery in detail and she understands this completely. We'll obtain a CBC with a diff and CMET today for preoperative evaluation as well.  Her primary care physician is a military physician at [employer].  Jeriel Vivanco J 08/12/2012, 8:53 AM    

## 2012-08-12 NOTE — H&P (Signed)
Melanie Jarvis is an 62 y.o. female.   Chief Complaint: left knee lateral meniscus tear HPI: Melanie Jarvis is a 62 year old seen for follow-up for significant left knee pain increasing in nature. We saw her on 10/04/11 for significant left knee pain with meniscal tearing. At that time we discussed left knee arthroscopy. She was previously seen at the [facility] by cardiology and was cleared for surgery but she decided to put this off. She now wants to proceed with left knee arthroscopy. She had a left knee MRI ordered by the military physicians done on 06/02/12 and revealed a complex tear of the lateral meniscus with adjacent parameniscal cyst. She also had a right knee MRI that showed a complex tear of the anterior horn of the medial meniscus. Her right knee is not bothering her as much as the left knee.   Past Medical History  Diagnosis Date  . Coronary artery disease   . Hypertension   . Myocardial infarction   . Dysrhythmia   . Hypothyroidism   . Anxiety   . Depression   . Shortness of breath   . GERD (gastroesophageal reflux disease)   . H/O hiatal hernia   . Headache   . Arthritis   . Fibromyalgia   . 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 Wears glasses   . HOH (hard of hearing)   . DVT (deep venous thrombosis)     multiple since age 61  . MI (myocardial infarction)     due to clots  . H/O gastric bypass     only has 20%stomack-no nsaids-gi bleed  . Asthma     SEVERE  . Head injury     years ago-memory loss  . Scoliosis   . DDD (degenerative disc disease), cervical   . DDD (degenerative disc disease), lumbar   . HNP (herniated nucleus pulposus)     multiple  . Lupus   . Abscess     chronic abscess chest comes and goes and has test positive for MRSA-steroids triggers it  . Complex tear of lateral meniscus of left knee as current injury   . Hx MRSA infection     Past Surgical History  Procedure Laterality Date  . Abdominal hysterectomy  1986  .  Appendectomy  1964  . Gastric bypass  2010    done at the [facility]  . Foot neuroma surgery  1971    rt foot  . Cardiac catheterization  2010    Family History  Problem Relation Age of Onset  . Diabetes    . Hypertension    . Heart disease    . Arthritis     Social History:  reports that she has never smoked. She does not have any smokeless tobacco history on file. She reports that she does not drink alcohol or use illicit drugs.  Allergies:  Allergies  Allergen Reactions  . Antihistamines, Chlorpheniramine-Type Shortness Of Breath    asthma  . Beta Adrenergic Blockers Shortness Of Breath    asthma  . Erythromycin Anaphylaxis  . Tetanus Toxoids Anaphylaxis  . Asa (Aspirin)     No po asa-gi bleed  . Xarelto (Rivaroxaban) Swelling    Mouth blisters   Current Outpatient Prescriptions on File Prior to Visit  Medication Sig Dispense Refill  . albuterol (PROVENTIL HFA;VENTOLIN HFA) 108 (90 BASE) MCG/ACT inhaler Inhale 2 puffs into the lungs every 4 (four) hours as needed for wheezing.      Marland Kitchen  albuterol (PROVENTIL) (2.5 MG/3ML) 0.083% nebulizer solution Take 2.5 mg by nebulization every 6 (six) hours as needed for wheezing.      Marland Kitchen ALPRAZolam (XANAX) 1 MG tablet Take 1 mg by mouth 3 (three) times daily as needed for sleep.      . benazepril (LOTENSIN) 20 MG tablet Take 20 mg by mouth daily.      . cyclobenzaprine (FLEXERIL) 10 MG tablet Take 10 mg by mouth 2 (two) times daily as needed for muscle spasms.      . famotidine (PEPCID) 20 MG tablet Take 20 mg by mouth 2 (two) times daily.      . fluticasone (FLONASE) 50 MCG/ACT nasal spray Place 2 sprays into the nose daily.      . furosemide (LASIX) 40 MG tablet Take 40 mg by mouth daily.      Marland Kitchen HYDROcodone-acetaminophen (NORCO) 10-325 MG per tablet Take 1 tablet by mouth every 6 (six) hours as needed for pain.      Marland Kitchen lansoprazole (PREVACID) 30 MG capsule Take 30 mg by mouth 2 (two) times daily. Takes 2 twice a day      .  levothyroxine (SYNTHROID, LEVOTHROID) 88 MCG tablet Take 88 mcg by mouth daily before breakfast.      . potassium chloride 20 MEQ/15ML (10%) solution Take 10 mEq by mouth daily.      . pseudoephedrine (SUDAFED) 30 MG tablet Take 30 mg by mouth every 4 (four) hours as needed for congestion.      . ursodiol (ACTIGALL) 300 MG capsule Take 300 mg by mouth 2 (two) times daily.      . vitamin C (ASCORBIC ACID) 500 MG tablet Take 500 mg by mouth daily.       No current facility-administered medications on file prior to visit.      (Not in a hospital admission)  No results found for this or any previous visit (from the past 48 hour(s)). No results found.  Review of Systems  Constitutional: Negative.   HENT: Negative.   Eyes: Negative.   Respiratory: Negative.   Cardiovascular: Negative.   Gastrointestinal: Negative.   Genitourinary: Negative.   Musculoskeletal: Positive for joint pain.       Left knee  Skin: Negative.   Neurological: Negative.   Endo/Heme/Allergies: Negative.   Psychiatric/Behavioral: Negative.     There were no vitals taken for this visit. Physical Exam  Constitutional: She is oriented to person, place, and time. She appears well-developed and well-nourished.  HENT:  Head: Normocephalic.  Eyes: EOM are normal. Pupils are equal, round, and reactive to light.  Neck: Neck supple.  Cardiovascular: Normal rate.   Respiratory: Effort normal.  GI: Soft.  Genitourinary:  Not pertinent to current symptomatology therefore not examined.  Musculoskeletal:  Examination of her left knee reveals pain medially and laterally 1+ synovitis full range of motion positive lateral McMurray's knee stable to ligamentous exam with normal patella tracking. Exam of the right knee reveals full range of motion without pain swelling weakness or instability. Vascular exam: pulses 2+ and symmetric.   Neurological: She is alert and oriented to person, place, and time.  Skin: Skin is warm and  dry.  Psychiatric: She has a normal mood and affect.    X-RAYS: X-rays of both knees standing AP lateral flexion and sunrise views show minimal degenerative changes. No acute bony or joint abnormality.   Assessment Left knee lateral meniscus tear with history of septic bursitis and lateral meniscal cyst. Right knee lateral  meniscus tear. History of DVT - factor V deficiency. Lupus. History of MI with negative cardiac workup. Status post gastric bypass.  Plan At this point I do think she has failed conservative treatment for her left knee and would recommend left knee arthroscopy. She will need to be treated with anticoagulation post-op. Discussed risks benefits and possible complications of the surgery in detail and she understands this completely. We'll obtain a CBC with a diff and CMET today for preoperative evaluation as well.  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.  Versa Craton J 08/12/2012, 8:53 AM

## 2012-08-12 NOTE — Interval H&P Note (Signed)
History and Physical Interval Note:  08/12/2012 12:13 PM  Melanie Jarvis  has presented today for surgery, with the diagnosis of medial and lateral miniscus tear  The various methods of treatment have been discussed with the patient and family. After consideration of risks, benefits and other options for treatment, the patient has consented to  Procedure(s): ARTHROSCOPY KNEE (Left) as a surgical intervention .  The patient's history has been reviewed, patient examined, no change in status, stable for surgery.  I have reviewed the patient's chart and labs.  Questions were answered to the patient's satisfaction.     Salvatore Marvel A

## 2012-08-12 NOTE — Anesthesia Preprocedure Evaluation (Signed)
Anesthesia Evaluation  Patient identified by MRN, date of birth, ID band Patient awake    Reviewed: Allergy & Precautions, H&P , NPO status , Patient's Chart, lab work & pertinent test results, reviewed documented beta blocker date and time   Airway Mallampati: II TM Distance: >3 FB Neck ROM: full    Dental   Pulmonary shortness of breath and with exertion, asthma ,  breath sounds clear to auscultation        Cardiovascular hypertension, On Medications + CAD and + Past MI negative cardio ROS  + dysrhythmias Rhythm:regular     Neuro/Psych  Headaches, PSYCHIATRIC DISORDERS  Neuromuscular disease    GI/Hepatic Neg liver ROS, hiatal hernia, GERD-  Medicated and Controlled,  Endo/Other  Hypothyroidism   Renal/GU negative Renal ROS  negative genitourinary   Musculoskeletal   Abdominal   Peds  Hematology  (+) Blood dyscrasia, ,   Anesthesia Other Findings See surgeon's H&P   Reproductive/Obstetrics negative OB ROS                           Anesthesia Physical Anesthesia Plan  ASA: III  Anesthesia Plan: General   Post-op Pain Management:    Induction: Intravenous  Airway Management Planned: LMA  Additional Equipment:   Intra-op Plan:   Post-operative Plan: Extubation in OR  Informed Consent: I have reviewed the patients History and Physical, chart, labs and discussed the procedure including the risks, benefits and alternatives for the proposed anesthesia with the patient or authorized representative who has indicated his/her understanding and acceptance.   Dental Advisory Given  Plan Discussed with: CRNA and Surgeon  Anesthesia Plan Comments:         Anesthesia Quick Evaluation

## 2012-08-12 NOTE — Anesthesia Postprocedure Evaluation (Signed)
Anesthesia Post Note  Patient: Melanie Jarvis  Procedure(s) Performed: Procedure(s) (LRB): ARTHROSCOPY KNEE, PARTIAL LATERAL MENISCECTOMY, CHONDROPLASTY (Left)  Anesthesia type: General  Patient location: PACU  Post pain: Pain level controlled  Post assessment: Patient's Cardiovascular Status Stable  Last Vitals:  Filed Vitals:   08/12/12 1345  BP:   Pulse: 57  Temp:   Resp: 14    Post vital signs: Reviewed and stable  Level of consciousness: alert  Complications: No apparent anesthesia complications

## 2012-08-12 NOTE — Anesthesia Procedure Notes (Addendum)
Procedure Name: LMA Insertion Date/Time: 08/12/2012 12:27 PM Performed by: Caren Macadam Pre-anesthesia Checklist: Patient identified, Emergency Drugs available, Suction available and Patient being monitored Patient Re-evaluated:Patient Re-evaluated prior to inductionOxygen Delivery Method: Circle System Utilized Preoxygenation: Pre-oxygenation with 100% oxygen Intubation Type: IV induction Ventilation: Mask ventilation without difficulty LMA: LMA with gastric port inserted LMA Size: 4.0 Number of attempts: 1 Airway Equipment and Method: bite block Placement Confirmation: positive ETCO2 and breath sounds checked- equal and bilateral Tube secured with: Tape Dental Injury: Teeth and Oropharynx as per pre-operative assessment     I was consulted by the surgeon to provide a pre-operative intra-articular injection. Patient was identified and a time out was performed. Intravenous sedation performed by RN in attendance. The operative knee was prepped and draped in a sterile fashion using betadine spray. Using sterile technique, 7.5 cc of Marcaine 0.25% was infiltrated over the 2 inferior portals of the operative knee using a 25g. 1.5 inch needle. An 18g. 1.5 inch needle was then used to inject 20 cc of 1.5% lidocaine with 1:200,000 epinephrine into the operative knee. The patient tolerated the procedure well.  Procedure performed personally by Janetta Hora. Gelene Mink, MD  Time start: 1237  Time end: 1242

## 2012-08-13 ENCOUNTER — Encounter (HOSPITAL_BASED_OUTPATIENT_CLINIC_OR_DEPARTMENT_OTHER): Payer: Self-pay | Admitting: Orthopedic Surgery

## 2012-08-13 NOTE — Op Note (Signed)
Melanie Jarvis, Melanie Jarvis              ACCOUNT NO.:  [id number]  MEDICAL RECORD NO.:  [id number]  LOCATION:                                 FACILITY:  PHYSICIAN:  Elana Alm. Thurston Hole, M.D. 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  PROCEDURE:  Left knee exam under anesthesia followed by arthroscopic partial lateral meniscectomy with chondroplasty.  SURGEON:  Elana Alm. Thurston Hole, MD  ANESTHESIA:  General.  OPERATIVE TIME:  30 minutes.  COMPLICATIONS:  None.  INDICATION FOR PROCEDURE:  Melanie Jarvis is a 62 year old woman who has had over a year left knee pain with exam and MRI documenting lateral meniscus tear with chondromalacia.  She has failed multiple conservative modalities and is now to undergo arthroscopy.  DESCRIPTION OF PROCEDURE:  Melanie Jarvis was brought to the operating room on Aug 12, 2012, after knee block was placed in the holding area by Anesthesia.  She was placed on the operating table in supine position. She received antibiotics preoperatively for prophylaxis.  After being placed under general anesthesia, her left knee was examined.  She had full range of motion, knee was stable.  Ligamentous exam with normal patellar tracking.  Left leg was prepped using sterile DuraPrep and draped using sterile technique.  Time-out procedure was called and the correct left knee identified.  Initially, through an anterolateral portal, the arthroscope with a pump attached was placed into an anteromedial portal.  An arthroscopic probe was placed.  On initial inspection of the medial compartment, she had 25% grade 3 chondromalacia which was debrided.  Medial meniscus was intact.  Intercondylar notch was inspected and the anterior and posterior  cruciate ligaments were normal.  Lateral compartment inspected.  The articular cartilage showed 25% to 30% grade 3 chondromalacia which was debrided.  Lateral meniscus showed a complex tear of the anterolateral and posterior horn.  80% of the anterior and lateral horn were resected back to stable rim and 30% to 40% of the posterior horn was resected back to a stable rim. Patellofemoral joint inspected.  She had only grade 1 and 2 chondromalacia.  The patella tracked normally.  Mediolateral gutter showed moderate synovitis which was debrided, otherwise this was free of pathology.  After this was done, it was felt that all pathology had been satisfactorily addressed.  The instruments removed.  Portals closed with 3-0 nylon suture.  Sterile dressings were applied, and the patient awakened, and taken to the recovery room in stable condition.  FOLLOWUP CARE:  Melanie Jarvis will be followed as an outpatient on Percocet and Lovenox due to a history of clotting disorder.  She will be seen back in the office in a week for sutures out and followup.     Loany Neuroth A. Thurston Hole, M.D.     RAW/MEDQ  D:  08/12/2012  T:  08/13/2012  Job:  [id number]

## 2017-06-11 ENCOUNTER — Encounter: Payer: Self-pay | Admitting: Family Medicine

## 2017-06-11 ENCOUNTER — Ambulatory Visit (INDEPENDENT_AMBULATORY_CARE_PROVIDER_SITE_OTHER): Payer: Medicare Other | Admitting: Family Medicine

## 2017-06-11 VITALS — BP 122/74 | HR 88 | Temp 98.3°F | Ht 66.0 in | Wt 182.0 lb

## 2017-06-11 DIAGNOSIS — Z8719 Personal history of other diseases of the digestive system: Secondary | ICD-10-CM

## 2017-06-11 DIAGNOSIS — G894 Chronic pain syndrome: Secondary | ICD-10-CM

## 2017-06-11 DIAGNOSIS — G4700 Insomnia, unspecified: Secondary | ICD-10-CM | POA: Diagnosis not present

## 2017-06-11 DIAGNOSIS — I10 Essential (primary) hypertension: Secondary | ICD-10-CM

## 2017-06-11 DIAGNOSIS — I82409 Acute embolism and thrombosis of unspecified deep veins of unspecified lower extremity: Secondary | ICD-10-CM | POA: Diagnosis not present

## 2017-06-11 DIAGNOSIS — R739 Hyperglycemia, unspecified: Secondary | ICD-10-CM | POA: Diagnosis not present

## 2017-06-11 MED ORDER — ONDANSETRON HCL 8 MG PO TABS: 8.0000 mg | ORAL_TABLET | Freq: Three times a day (TID) | ORAL | 2 refills | Status: DC | PRN

## 2017-06-11 MED ORDER — ROSUVASTATIN CALCIUM 10 MG PO TABS: 10.0000 mg | ORAL_TABLET | Freq: Two times a day (BID) | ORAL | 2 refills | Status: DC

## 2017-06-11 MED ORDER — TRAZODONE HCL 50 MG PO TABS: 50.0000 mg | ORAL_TABLET | Freq: Every evening | ORAL | 3 refills | Status: DC | PRN

## 2017-06-11 MED ORDER — LANSOPRAZOLE 30 MG PO CPDR: 30.0000 mg | DELAYED_RELEASE_CAPSULE | Freq: Two times a day (BID) | ORAL | 2 refills | Status: DC

## 2017-06-11 MED ORDER — HYDROXYZINE HCL 25 MG PO TABS: 25.0000 mg | ORAL_TABLET | Freq: Three times a day (TID) | ORAL | 2 refills | Status: DC | PRN

## 2017-06-11 MED ORDER — TAMSULOSIN HCL 0.4 MG PO CAPS: 0.8000 mg | ORAL_CAPSULE | Freq: Every day | ORAL | 2 refills | Status: DC

## 2017-06-11 MED ORDER — BENZONATATE 100 MG PO CAPS: 100.0000 mg | ORAL_CAPSULE | Freq: Three times a day (TID) | ORAL | 2 refills | Status: DC

## 2017-06-11 MED ORDER — LISINOPRIL-HYDROCHLOROTHIAZIDE 20-25 MG PO TABS: 1.0000 | ORAL_TABLET | Freq: Every day | ORAL | 2 refills | Status: DC

## 2017-06-11 NOTE — Assessment & Plan Note (Addendum)
Patient with history of multiple providers and pharmacies on her drug database review.  Additionally, patient gives inconsistent history regarding her Xanax/alprazolam usage.  For example, stated that she had only been on alprazolam since December, however she was given this for several months based on her drug database review.   Advised patient that I would not be providing chronic narcotics or benzodiazepines.  Patient voiced understanding.  Will place referral to pain management in the [place] area given that she will be moving here soon.

## 2017-06-11 NOTE — Assessment & Plan Note (Signed)
Recommended against benzodiazepine and other sedatives.  Will start trazodone.

## 2017-06-11 NOTE — Assessment & Plan Note (Signed)
Blood pressure at goal.  Continue lisinopril-HCTZ.  Obtain records from previous PCP.

## 2017-06-11 NOTE — Progress Notes (Signed)
Subjective:  Melanie Jarvis is a 67 y.o. female who presents today with a chief complaint of hyperglycemia and to establish care.   HPI:  Patient is from near [place], [place].  States that she will be moving to [place] with her husband in the near future.  Patient is a former Engineer, civil (consulting).  Hyperglycemia, new problem Patient diagnosed with elevated blood sugar about 4-5 months ago.  She was placed on metformin 500 mg twice daily.  She has done well on this medication without side effects.  Patient thinks that her last A1c was less than 6.  She has tried to make some dietary modifications.  History of VTE, new problem Patient with multiple complications of blood clots.  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  She was previously on Lovenox injections which she has since stopped due to formation of hematoma in her abdomen.  She is not currently on any sort of blood thinners.  No lower extremity swelling.  No shortness of breath.  Hypertension, new problem Several year history.  Currently on lisinopril-HCTZ 20-25 tablet once daily.  Tolerates this well without side effects.  Hiatal hernia, new problem History of gastric bypass.  She has been on Prevacid for several years and is stable on this dose.  Chronic pain, new problem Several year history.  Patient states she has a history of chronic neck and back pain with cervical disc disease.  She has been followed by neurosurgery at [facility].  Currently prescribed oxycodone-acetaminophen 5-325 tablets.  Insomnia, new problem Several year history.  Worsened recently over the past several months.  Patient states that she was on alprazolam starting in December.  Patient also stated that she had never been prescribed alprazolam prior to this.  Patient also stated that she was started on Xanax in the early 90s to "keep her from having a heart attack  in her sleep"   ROS: Per HPI, otherwise a complete review of systems was negative.   PMH:  The following were reviewed and entered/updated in epic: Past Medical History:  Diagnosis Date  . Abscess    chronic abscess chest comes and goes and has test positive for MRSA-steroids triggers it  . Anxiety   . Arthritis   . Asthma    SEVERE  . 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 Complex tear of lateral meniscus of left knee as current injury   . Coronary artery disease   . DDD (degenerative disc disease), cervical   . DDD (degenerative disc disease), lumbar   . Depression   . DVT (deep venous thrombosis) (HCC)    multiple since age 40  . Dysrhythmia   . Fibromyalgia   . GERD (gastroesophageal reflux disease)   . H/O gastric bypass    only has 20%stomack-no nsaids-gi bleed  . H/O hiatal hernia   . Head injury    years ago-memory loss  . Headache(784.0)   . HNP (herniated nucleus pulposus)    multiple  . HOH (hard of hearing)   . Hx MRSA infection   . Hypertension   . Hypothyroidism   . Lupus   . 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 Myocardial infarction (HCC)   . Scoliosis   . Shortness of breath   . Wears glasses    Patient Active Problem List   Diagnosis  Date Noted  . Hyperglycemia 06/11/2017  . Chronic pain syndrome 06/11/2017  . Insomnia 06/11/2017  . Hx MRSA infection   . Complex tear of lateral meniscus of left knee as current injury   . Abscess   . Lupus   . HNP (herniated nucleus pulposus)   . DDD (degenerative disc disease), lumbar   . Scoliosis   . Head injury   . H/O gastric bypass   . DVT (deep venous thrombosis) (HCC)   . HOH (hard of hearing)   . Wears glasses   . Blood dyscrasia   . Fibromyalgia   . Arthritis   . Headache(784.0)   . H/O hiatal hernia   . Coronary artery disease   . Hypertension   . Dysrhythmia   . Hypothyroidism   . Depression   . GERD (gastroesophageal reflux disease)    Past Surgical  History:  Procedure Laterality Date  . ABDOMINAL HYSTERECTOMY  1986  . APPENDECTOMY  1964  . CARDIAC CATHETERIZATION  2010  . FOOT NEUROMA SURGERY  1971   rt foot  . GASTRIC BYPASS  2010   done at the [facility]  . KNEE ARTHROSCOPY Left 08/12/2012   Procedure: ARTHROSCOPY KNEE, PARTIAL LATERAL MENISCECTOMY, CHONDROPLASTY;  Surgeon: Nilda Simmerobert A Wainer, MD;  Location: [facility];  Service: Orthopedics;  Laterality: Left;   Family History  Problem Relation Age of Onset  . Diabetes Unknown   . Hypertension Unknown   . Heart disease Unknown   . Arthritis Unknown    Medications- reviewed and updated Current Outpatient Medications  Medication Sig Dispense Refill  . benzonatate (TESSALON) 100 MG capsule Take 1 capsule (100 mg total) by mouth 3 (three) times daily. 20 capsule 2  . hydrOXYzine (ATARAX/VISTARIL) 25 MG tablet Take 1 tablet (25 mg total) by mouth every 8 (eight) hours as needed. 30 tablet 2  . lansoprazole (PREVACID) 30 MG capsule Take 1 capsule (30 mg total) by mouth 2 (two) times daily. Takes 2 twice a day 30 capsule 2  . lisinopril-hydrochlorothiazide (PRINZIDE,ZESTORETIC) 20-25 MG tablet Take 1 tablet by mouth daily. 30 tablet 2  . ondansetron (ZOFRAN) 8 MG tablet Take 1 tablet (8 mg total) by mouth every 8 (eight) hours as needed for nausea or vomiting. 20 tablet 2  . oxyCODONE-acetaminophen (ROXICET) 5-325 MG per tablet 1-2 tablets every 4-6 hrs as needed for pain 30 tablet 0  . phenazopyridine (PYRIDIUM) 95 MG tablet Take 95 mg by mouth 3 (three) times daily as needed for pain.    . potassium chloride 20 MEQ/15ML (10%) solution Take 10 mEq by mouth daily.    . rosuvastatin (CRESTOR) 10 MG tablet Take 1 tablet (10 mg total) by mouth 2 (two) times daily. 30 tablet 2  . sulfamethoxazole-trimethoprim (BACTRIM DS,SEPTRA DS) 800-160 MG tablet Take 1 tablet by mouth at bedtime.    . tamsulosin (FLOMAX) 0.4 MG CAPS capsule Take 2 capsules (0.8 mg total) by mouth at  bedtime. 30 capsule 2  . ursodiol (ACTIGALL) 300 MG capsule Take 300 mg by mouth 2 (two) times daily.    . traZODone (DESYREL) 50 MG tablet Take 1-2 tablets (50-100 mg total) by mouth at bedtime as needed for sleep. 30 tablet 3   No current facility-administered medications for this visit.     Allergies-reviewed and updated Allergies  Allergen Reactions  . Antihistamines, Chlorpheniramine-Type Shortness Of Breath    asthma  . Beta Adrenergic Blockers Shortness Of Breath    asthma  . Erythromycin  Anaphylaxis  . Tetanus Toxoids Anaphylaxis  . Asa [Aspirin]     No po asa-gi bleed  . Latex     "second degree burns from tape with latex"  . Xarelto [Rivaroxaban] Swelling    Mouth blisters    Social History   Socioeconomic History  . Marital status: Married    Spouse name: None  . Number of children: None  . Years of education: None  . Highest education level: None  Social Needs  . Financial resource strain: None  . Food insecurity - worry: None  . Food insecurity - inability: None  . Transportation needs - medical: None  . Transportation needs - non-medical: None  Occupational History  . None  Tobacco Use  . Smoking status: Never Smoker  . Smokeless tobacco: Never Used  Substance and Sexual Activity  . Alcohol use: No  . Drug use: No  . Sexual activity: None  Other Topics Concern  . None  Social History Narrative  . None     Objective:  Physical Exam: BP 122/74 (BP Location: Left Arm, Patient Position: Sitting, Cuff Size: Normal)   Pulse 88   Temp 98.3 F (36.8 C) (Oral)   Ht 5\' 6"  (1.676 m)   Wt 182 lb (82.6 kg)   SpO2 96%   BMI 29.38 kg/m   Gen: NAD, resting comfortably CV: RRR with no murmurs appreciated Pulm: NWOB, CTAB with no crackles, wheezes, or rhonchi GI: Normal bowel sounds present. Soft, Nontender, Nondistended. MSK: No edema, cyanosis, or clubbing noted Skin: Warm, dry Neuro: Grossly normal, moves all extremities Psych: Normal affect and  thought content  Assessment/Plan:  Hyperglycemia Patient would like to stop nonessential medications.  We will stop metformin today.  Obtain prior records.  Will likely need A1c soon.  DVT (deep venous thrombosis) (HCC) No signs of DVT or PE today.  She has an unusual history- sounds like she has had 3 episodes of blood clots in the last 3 months despite treatment with Lovenox.  We will obtain her prior records.  Will likely need referral to hematology in the near future.  Hypertension Blood pressure at goal.  Continue lisinopril-HCTZ.  Obtain records from previous PCP.  H/O hiatal hernia Continue Prevacid.  Discussed long-term side effects of this medication.  Chronic pain syndrome Patient with history of multiple providers and pharmacies on her drug database review.  Additionally, patient gives inconsistent history regarding her Xanax/alprazolam usage.  For example, stated that she had only been on alprazolam since December, however she was given this for several months based on her drug database review.   Advised patient that I would not be providing chronic narcotics or benzodiazepines.  Patient voiced understanding.  Will place referral to pain management in the [employer] area given that she will be moving here soon.  Insomnia Recommended against benzodiazepine and other sedatives.  Will start trazodone.  Katina Degree. Jimmey Ralph, MD 06/11/2017 5:14 PM

## 2017-06-11 NOTE — Assessment & Plan Note (Signed)
Patient would like to stop nonessential medications.  We will stop metformin today.  Obtain prior records.  Will likely need A1c soon.

## 2017-06-11 NOTE — Assessment & Plan Note (Signed)
No signs of DVT or PE today.  She has an unusual history- sounds like she has had 3 episodes of blood clots in the last 3 months despite treatment with Lovenox.  We will obtain her prior records.  Will likely need referral to hematology in the near future.

## 2017-06-11 NOTE — Patient Instructions (Addendum)
It was very nice to meet you.   Please start the trazodone.  Come back to see me in a few months, or sooner as needed.  Take care, Dr Jimmey RalphParker

## 2017-06-11 NOTE — Assessment & Plan Note (Signed)
Continue Prevacid.  Discussed long-term side effects of this medication.

## 2017-07-08 ENCOUNTER — Ambulatory Visit: Payer: Medicare Other | Admitting: Family Medicine

## 2017-07-09 ENCOUNTER — Ambulatory Visit: Payer: Medicare Other | Admitting: Family Medicine

## 2017-07-10 ENCOUNTER — Ambulatory Visit: Payer: Medicare Other | Admitting: Family Medicine

## 2017-07-10 DIAGNOSIS — Z0289 Encounter for other administrative examinations: Secondary | ICD-10-CM

## 2017-07-12 ENCOUNTER — Ambulatory Visit: Payer: Medicare Other | Admitting: Family Medicine

## 2017-07-17 ENCOUNTER — Encounter: Payer: Self-pay | Admitting: Family Medicine

## 2017-08-07 ENCOUNTER — Telehealth: Payer: Self-pay | Admitting: Family Medicine

## 2017-08-07 NOTE — Telephone Encounter (Signed)
Copied from CRM [id number]. Topic: Quick Communication - Rx Refill/Question >> Aug 07, 2017  4:55 PM Alexander Bergeron B wrote: Pt called and states she hurt her back and is needing muscle relaxer's and she is in bad pain; pt is willing to be seen but if she can get at least a pill or two for pain and come in tomorrow that would be great, call pt to advise   Medication: cyclobenzaprine (FLEXERIL) 10 MG tablet [[id number]]  DISCONTINUED  Has the patient contacted their pharmacy? No. (Agent: If no, request that the patient contact the pharmacy for the refill.) Preferred Pharmacy (with phone number or street name): [facility]  Agent: Please be advised that RX refills may take up to 3 business days. We ask that you follow-up with your pharmacy.

## 2017-08-08 ENCOUNTER — Encounter (HOSPITAL_COMMUNITY): Payer: Self-pay

## 2017-08-08 ENCOUNTER — Ambulatory Visit (INDEPENDENT_AMBULATORY_CARE_PROVIDER_SITE_OTHER): Payer: Medicare Other | Admitting: Family Medicine

## 2017-08-08 ENCOUNTER — Ambulatory Visit: Payer: Self-pay

## 2017-08-08 ENCOUNTER — Emergency Department (HOSPITAL_COMMUNITY)
Admission: EM | Admit: 2017-08-08 | Discharge: 2017-08-08 | Disposition: A | Discharge status: Home / Self Care | Payer: Medicare Other | Attending: Emergency Medicine | Admitting: Emergency Medicine

## 2017-08-08 ENCOUNTER — Emergency Department (HOSPITAL_COMMUNITY): Payer: Medicare Other

## 2017-08-08 ENCOUNTER — Other Ambulatory Visit: Payer: Self-pay

## 2017-08-08 ENCOUNTER — Ambulatory Visit: Payer: Medicare Other | Admitting: Family Medicine

## 2017-08-08 ENCOUNTER — Encounter: Payer: Self-pay | Admitting: Family Medicine

## 2017-08-08 VITALS — BP 168/86 | HR 66 | Temp 98.4°F | Ht 65.0 in | Wt 178.0 lb

## 2017-08-08 DIAGNOSIS — R200 Anesthesia of skin: Secondary | ICD-10-CM | POA: Diagnosis not present

## 2017-08-08 DIAGNOSIS — I10 Essential (primary) hypertension: Secondary | ICD-10-CM | POA: Diagnosis not present

## 2017-08-08 DIAGNOSIS — I2510 Atherosclerotic heart disease of native coronary artery without angina pectoris: Secondary | ICD-10-CM | POA: Diagnosis not present

## 2017-08-08 DIAGNOSIS — E039 Hypothyroidism, unspecified: Secondary | ICD-10-CM | POA: Insufficient documentation

## 2017-08-08 DIAGNOSIS — K2100 Gastro-esophageal reflux disease with esophagitis, without bleeding: Secondary | ICD-10-CM

## 2017-08-08 DIAGNOSIS — Z79899 Other long term (current) drug therapy: Secondary | ICD-10-CM | POA: Insufficient documentation

## 2017-08-08 DIAGNOSIS — G4700 Insomnia, unspecified: Secondary | ICD-10-CM

## 2017-08-08 DIAGNOSIS — R61 Generalized hyperhidrosis: Secondary | ICD-10-CM | POA: Insufficient documentation

## 2017-08-08 DIAGNOSIS — R531 Weakness: Secondary | ICD-10-CM | POA: Diagnosis not present

## 2017-08-08 DIAGNOSIS — M549 Dorsalgia, unspecified: Secondary | ICD-10-CM | POA: Insufficient documentation

## 2017-08-08 DIAGNOSIS — R002 Palpitations: Secondary | ICD-10-CM | POA: Diagnosis not present

## 2017-08-08 DIAGNOSIS — J45909 Unspecified asthma, uncomplicated: Secondary | ICD-10-CM | POA: Insufficient documentation

## 2017-08-08 DIAGNOSIS — M542 Cervicalgia: Secondary | ICD-10-CM | POA: Insufficient documentation

## 2017-08-08 DIAGNOSIS — R079 Chest pain, unspecified: Secondary | ICD-10-CM

## 2017-08-08 LAB — BASIC METABOLIC PANEL
ANION GAP: 10 (ref 5–15)
BUN: 10 mg/dL (ref 6–20)
CHLORIDE: 106 mmol/L (ref 101–111)
CO2: 25 mmol/L (ref 22–32)
Calcium: 9 mg/dL (ref 8.9–10.3)
Creatinine, Ser: 1.27 mg/dL — ABNORMAL HIGH (ref 0.44–1.00)
GFR, EST AFRICAN AMERICAN: 49 mL/min — AB (ref >60)
GFR, EST NON AFRICAN AMERICAN: 43 mL/min — AB (ref >60)
Glucose, Bld: 151 mg/dL — ABNORMAL HIGH (ref 65–99)
POTASSIUM: 3.5 mmol/L (ref 3.5–5.1)
Sodium: 141 mmol/L (ref 135–145)

## 2017-08-08 LAB — CBC
HEMATOCRIT: 43 % (ref 36.0–46.0)
Hemoglobin: 13.4 g/dL (ref 12.0–15.0)
MCH: 27.2 pg (ref 26.0–34.0)
MCHC: 31.2 g/dL (ref 30.0–36.0)
MCV: 87.2 fL (ref 78.0–100.0)
Platelets: 236 10*3/uL (ref 150–400)
RBC: 4.93 MIL/uL (ref 3.87–5.11)
RDW: 14 % (ref 11.5–15.5)
WBC: 9.4 10*3/uL (ref 4.0–10.5)

## 2017-08-08 LAB — I-STAT TROPONIN, ED
Troponin i, poc: 0.01 ng/mL (ref 0.00–0.08)
Troponin i, poc: 0 ng/mL (ref 0.00–0.08)

## 2017-08-08 MED ORDER — PREDNISONE 20 MG PO TABS: 20.0000 mg | ORAL_TABLET | Freq: Every day | ORAL | 0 refills | Status: AC

## 2017-08-08 MED ORDER — CYCLOBENZAPRINE HCL 10 MG PO TABS: 10.0000 mg | ORAL_TABLET | Freq: Three times a day (TID) | ORAL | 0 refills | Status: DC | PRN

## 2017-08-08 MED ORDER — LANSOPRAZOLE 30 MG PO CPDR: 30.0000 mg | DELAYED_RELEASE_CAPSULE | Freq: Two times a day (BID) | ORAL | 2 refills | Status: DC

## 2017-08-08 MED ORDER — CYCLOBENZAPRINE HCL 10 MG PO TABS: 10.0000 mg | ORAL_TABLET | Freq: Two times a day (BID) | ORAL | 0 refills | Status: DC | PRN

## 2017-08-08 MED ORDER — ASPIRIN 81 MG PO CHEW: 324.0000 mg | CHEWABLE_TABLET | Freq: Once | ORAL | Status: DC

## 2017-08-08 MED ORDER — LISINOPRIL-HYDROCHLOROTHIAZIDE 20-25 MG PO TABS: 1.0000 | ORAL_TABLET | Freq: Every day | ORAL | 2 refills | Status: DC

## 2017-08-08 MED ORDER — HYDROXYZINE HCL 25 MG PO TABS: 25.0000 mg | ORAL_TABLET | Freq: Three times a day (TID) | ORAL | 2 refills | Status: DC | PRN

## 2017-08-08 MED ORDER — CYCLOBENZAPRINE HCL 10 MG PO TABS
10.0000 mg | ORAL_TABLET | Freq: Once | ORAL | Status: AC
  Administered 2017-08-08: 10 mg via ORAL
  Filled 2017-08-08: qty 1

## 2017-08-08 NOTE — ED Provider Notes (Signed)
[facility]-EMERGENCY DEPT Provider Note   CSN: [id number] Arrival date & time: 08/08/17  1743     History   Chief Complaint Chief Complaint  Patient presents with  . Chest Pain    HPI Melanie Jarvis is a 67 y.o. female.  HPI   Reports she presented with concern for back and knee pain at PCPs Saw PCP who listened to heart and said he was going to an ECG. Then he repeated it and wanted to send her to ED.  Per PCP note, had reported chest pain to PCP Now reports she feels it was more like palpitations, like "flipping", and discomfort Not worse with exertion. Notices at night.  No chest pain or shortness of breath Notices it when laying in bed, nothing else makes it better or worse No nausea, no vomiting Every night having sweats, has to change gowns, just started over last month Has been a few weeks without blood pressure medications Has hx of numbness in left hand since surgery but is worse over the last week, neck pain over the last week. No new injuries. No loss of control of bowel or bladder.     Past Medical History:  Diagnosis Date  . Abscess    chronic abscess chest comes and goes and has test positive for MRSA-steroids triggers it  . Anxiety   . Arthritis   . Asthma    SEVERE  . 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 Complex tear of lateral meniscus of left knee as current injury   . Coronary artery disease   . DDD (degenerative disc disease), cervical   . DDD (degenerative disc disease), lumbar   . Depression   . DVT (deep venous thrombosis) (HCC)    multiple since age 76  . Dysrhythmia   . Fibromyalgia   . GERD (gastroesophageal reflux disease)   . H/O gastric bypass    only has 20%stomack-no nsaids-gi bleed  . H/O hiatal hernia   . Head injury    years ago-memory loss  . Headache(784.0)   . HNP (herniated nucleus pulposus)    multiple  . HOH (hard of hearing)   . Hx MRSA infection   . Hypertension   .  Hypothyroidism   . Lupus (HCC)   . 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 Myocardial infarction (HCC)   . Scoliosis   . Shortness of breath   . Wears glasses     Patient Active Problem List   Diagnosis Date Noted  . Hyperglycemia 06/11/2017  . Chronic pain syndrome 06/11/2017  . Insomnia 06/11/2017  . Hx MRSA infection   . Complex tear of lateral meniscus of left knee as current injury   . Abscess   . Lupus (HCC)   . HNP (herniated nucleus pulposus)   . DDD (degenerative disc disease), lumbar   . Scoliosis   . Head injury   . H/O gastric bypass   . DVT (deep venous thrombosis) (HCC)   . HOH (hard of hearing)   . Wears glasses   . Blood dyscrasia   . Fibromyalgia   . Arthritis   . Headache(784.0)   . H/O hiatal hernia   . Coronary artery disease   . Hypertension   . Dysrhythmia   . Hypothyroidism   . Depression   . GERD (gastroesophageal reflux disease)     Past Surgical History:  Procedure Laterality Date  . ABDOMINAL HYSTERECTOMY  1986  .  APPENDECTOMY  1964  . CARDIAC CATHETERIZATION  2010  . FOOT NEUROMA SURGERY  1971   rt foot  . GASTRIC BYPASS  2010   done at the [facility]  . KNEE ARTHROSCOPY Left 08/12/2012   Procedure: ARTHROSCOPY KNEE, PARTIAL LATERAL MENISCECTOMY, CHONDROPLASTY;  Surgeon: Nilda Simmer, MD;  Location: [facility];  Service: Orthopedics;  Laterality: Left;     OB History   None      Home Medications    Prior to Admission medications   Medication Sig Start Date End Date Taking? Authorizing Provider  lisinopril-hydrochlorothiazide (PRINZIDE,ZESTORETIC) 20-25 MG tablet Take 1 tablet by mouth daily. 08/08/17  Yes Ardith Dark, MD  traZODone (DESYREL) 50 MG tablet Take 1-2 tablets (50-100 mg total) by mouth at bedtime as needed for sleep. 06/11/17  Yes Ardith Dark, MD  cyclobenzaprine (FLEXERIL) 10 MG tablet Take 1 tablet (10 mg total) by mouth 2 (two) times daily as needed for muscle spasms.  08/08/17   Alvira Monday, MD  Glucose Blood (COOL BLOOD GLUCOSE TEST STRIPS VI) Blood Glucose Test strips  Take 1 strip every day by miscell. route.    [provider]  hydrOXYzine (ATARAX/VISTARIL) 25 MG tablet Take 1 tablet (25 mg total) by mouth every 8 (eight) hours as needed. Patient not taking: Reported on 08/08/2017 08/08/17   Ardith Dark, MD  lansoprazole (PREVACID) 30 MG capsule Take 1 capsule (30 mg total) by mouth 2 (two) times daily. Takes 2 twice a day Patient not taking: Reported on 08/08/2017 08/08/17   Ardith Dark, MD  ondansetron (ZOFRAN) 8 MG tablet Take 1 tablet (8 mg total) by mouth every 8 (eight) hours as needed for nausea or vomiting. Patient not taking: Reported on 08/08/2017 06/11/17   Ardith Dark, MD  [facility] DELICA LANCETS 33G MISC OneTouch Delica Lancets 33 gauge    [provider]  oxyCODONE-acetaminophen (ROXICET) 5-325 MG per tablet 1-2 tablets every 4-6 hrs as needed for pain Patient not taking: Reported on 08/08/2017 08/12/12   Shepperson, Kirstin, PA-C  predniSONE (DELTASONE) 20 MG tablet Take 1 tablet (20 mg total) by mouth daily with breakfast for 5 days. Patient not taking: Reported on 08/08/2017 08/08/17 08/13/17  Ardith Dark, MD  rosuvastatin (CRESTOR) 10 MG tablet Take 1 tablet (10 mg total) by mouth 2 (two) times daily. Patient not taking: Reported on 08/08/2017 06/11/17   Ardith Dark, MD  tamsulosin (FLOMAX) 0.4 MG CAPS capsule Take 2 capsules (0.8 mg total) by mouth at bedtime. Patient not taking: Reported on 08/08/2017 06/11/17   Ardith Dark, MD    Family History Family History  Problem Relation Age of Onset  . Diabetes Unknown   . Hypertension Unknown   . Heart disease Unknown   . Arthritis Unknown     Social History Social History   Tobacco Use  . Smoking status: Never Smoker  . Smokeless tobacco: Never Used  Substance Use Topics  . Alcohol use: No  . Drug use: No     Allergies   Antihistamines,  chlorpheniramine-type; Beta adrenergic blockers; Erythromycin; Ginseng; Tetanus toxoid; Tetanus toxoids; Thallous chloride tl 201; 2-(ethylmercuriothio)benzoic acid; Ace inhibitors; Antazoline; Asa [aspirin]; Diltiazem; Latex; Piperazine; and Xarelto [rivaroxaban]   Review of Systems Review of Systems  Constitutional: Negative for fever.  HENT: Negative for sore throat.   Eyes: Negative for visual disturbance.  Respiratory: Negative for cough and shortness of breath.   Cardiovascular: Negative for chest pain.  Gastrointestinal:  Negative for abdominal pain, nausea and vomiting.  Genitourinary: Negative for difficulty urinating and dysuria.  Musculoskeletal: Positive for arthralgias, back pain (upper back pain) and neck pain (1 week ago).  Skin: Negative for rash.  Neurological: Positive for weakness (had it chronically but worse) and numbness. Negative for syncope, facial asymmetry and headaches.     Physical Exam Updated Vital Signs BP (!) 160/58   Pulse 72   Temp 98.3 F (36.8 C) (Oral)   Resp 18   SpO2 98%   Physical Exam  Constitutional: She is oriented to person, place, and time. She appears well-developed and well-nourished. No distress.  HENT:  Head: Normocephalic and atraumatic.  Eyes: Conjunctivae and EOM are normal.  Neck: Normal range of motion.  Cardiovascular: Normal rate, regular rhythm, normal heart sounds and intact distal pulses. Exam reveals no gallop and no friction rub.  No murmur heard. Pulmonary/Chest: Effort normal and breath sounds normal. No respiratory distress. She has no wheezes. She has no rales.  Abdominal: Soft. She exhibits no distension. There is no tenderness. There is no guarding.  Musculoskeletal: She exhibits no edema.       Cervical back: She exhibits tenderness (left paraspinal muscles).  Pulses 2+ bilaterally  Neurological: She is alert and oriented to person, place, and time.  Chronic numbness left radial side of hand, chronic weak grip  strength   Skin: Skin is warm and dry. No rash noted. She is not diaphoretic. No erythema.  Nursing note and vitals reviewed.    ED Treatments / Results  Labs (all labs ordered are listed, but only abnormal results are displayed) Labs Reviewed  BASIC METABOLIC PANEL - Abnormal; Notable for the following components:      Result Value   Glucose, Bld 151 (*)    Creatinine, Ser 1.27 (*)    GFR calc non Af Amer 43 (*)    GFR calc Af Amer 49 (*)    All other components within normal limits  CBC  I-STAT TROPONIN, ED  I-STAT TROPONIN, ED    EKG EKG Interpretation  Date/Time:  Thursday Aug 08 2017 17:59:45 EDT Ventricular Rate:  66 PR Interval:    QRS Duration: 85 QT Interval:  467 QTC Calculation: 490 R Axis:   -34 Text Interpretation:  Sinus rhythm Low voltage, precordial leads LVH with secondary repolarization abnormality Anterior Q waves, possibly due to LVH No significant change since last tracing Confirmed by Alvira Monday ([id number]) on 08/08/2017 6:48:25 PM   Radiology Dg Chest 2 View  Result Date: 08/08/2017 CLINICAL DATA:  Chest pain EXAM: CHEST - 2 VIEW COMPARISON:  None. FINDINGS: Lungs are clear.  No pleural effusion or pneumothorax. The heart is normal in size. Degenerative changes of the visualized thoracolumbar spine. Cervical spine fixation hardware. IMPRESSION: Normal chest radiographs. Electronically Signed   By: Charline Bills M.D.   On: 08/08/2017 18:39    Procedures Procedures (including critical care time)  Medications Ordered in ED Medications  cyclobenzaprine (FLEXERIL) tablet 10 mg (10 mg Oral Given 08/08/17 2123)     Initial Impression / Assessment and Plan / ED Course  I have reviewed the triage vital signs and the nursing notes.  Pertinent labs & imaging results that were available during my care of the patient were reviewed by me and considered in my medical decision making (see chart for details).     67 year old female with history above  presents from PCPs office with concern for chest pain.  Patient reports that she had  presented to the PCP with concern for worsening of her chronic pain and being out of her medications, and reports that she was describing more of a sensation of palpitations to her primary care doctor rather than pain.  EKG was done in primary care doctor's office and she was sent here for further evaluation.  On my exam and history, patient denies any chest pain.  Reports that she has left-sided neck pain which is worse with movements, and has tenderness there on exam.  Feel this is most consistent with musculoskeletal pain.  No fevers or signs of acute cord compression.  She reports palpitations, even while in the emergency department.  EKG does not show signs of arrhythmia, patient with normal sinus rhythm on telemetry.  Delta troponins were checked and were both within normal limits.  EKG does not show significant changes from prior.  No signs of ACS.  Recommend outpatient follow-up.  Patient discharged in stable condition with understanding of reasons to return.   Final Clinical Impressions(s) / ED Diagnoses   Final diagnoses:  Neck pain  Palpitations    ED Discharge Orders        Ordered    cyclobenzaprine (FLEXERIL) 10 MG tablet  2 times daily PRN     08/08/17 2205       Alvira Monday, MD 08/09/17 [phone]

## 2017-08-08 NOTE — Progress Notes (Addendum)
Subjective:  Melanie Jarvis is a 67 y.o. female who presents today for same-day appointment with a chief complaint of back pain.   HPI:  Neck Pain, Acute Issue Started yesterday. Worsened over that time. Located in her lower back. Thinks she injured her back yesterday while moving furniture. She has tried using a heating pad without significant relief. She has not tried any other medications. She has a history of chronic neck pain and see neurosurgery at [facility]. She has chronic arm weakness that is at its baseline.  No bowel or bladder incontinence.  Hypertension, established problem, Worsened Patient has been off all of her blood pressure medications for the past several days. Reports that  Her pharmacy told her that she could not get any refills.   Chest Pain, Acute Issue Near the conclusion of the visit, patient reported that she had been having substernal chest pain for the past 4 days. Pain is sharp and stabbing in nature.   ROS: Per HPI  PMH: She reports that she has never smoked. She has never used smokeless tobacco. She reports that she does not drink alcohol or use drugs.  Objective:  Physical Exam: BP (!) 168/86   Pulse 66   Temp 98.4 F (36.9 C) (Oral)   Ht  (1.651 m)   Wt 178 lb (80.7 kg)   SpO2 99%   BMI 29.62 kg/m   Gen: NAD, resting comfortably CV: RRR with no murmurs appreciated Pulm: NWOB, CTAB with no crackles, wheezes, or rhonchi GI: Normal bowel sounds present. Soft, Nontender, Nondistended. MSK:  -Neck: No deformities.  Full range of motion.  Mildly tender to palpation along midline. -Arms: No deformities.  Sensation light touch intact throughout.  Mild weakness in left arm compared to arm arm -Back: No deformities.  Full range of motion.  Mildly tender to palpation along midline. -Lower extremities: No deformities.  Strength 5 out of 5 throughout.  EKG: Sinus rhythm.  Poor R wave progression.  No notable ST changes.  Assessment/Plan:  Chest  pain Patient reports having 3 prior heart attacks.  She has poor R wave progression on her EKG, however no other signs of acute ischemia.  Given her cardiac history as well as EKG findings patient was advised to go to the ED via EMS for cardiac rule out.  Patient declined EMS transport and stated that her grandson will be taking her to the emergency department instead.  Discussed risks of declining EMS including risk to life, however patient continued to decline.  Patient was given 324 mg of aspirin prior to leaving the office.  AMA form was completed by the patient prior to leaving the office. Patient stated that she would be going to [facility] ED.   Neck pain/back pain No red flag signs or symptoms.  Start Flexeril.  Will avoid NSAIDs given concern for cardiac disease.  Start low-dose prednisone burst.  Advised patient follow-up with her neurosurgeon.  Hypertension Unclear why patient has not been compliant with her medications.  Patient's pharmacy was called and told us that she had a prescription for lisinopril-HCTZ that was sent in on 06/11/2017 and the patient did not pick up.  Patient was last seen in the pharmacy on 4/20 08/2017 when she picked up her trazodone.  Patient stated that she was not aware that her blood pressure pill was able to be picked up the last time she was there.  She additionally gives inconsistent history as she tells me that she  has only been out of medications "for a few days."  GERD Prevacid refilled.  Insomnia Patient on trazodone 50 mg daily.  Per her pharmacy records, her prescription was last picked up on 4/20 08/2017.  We also refilled her hydroxyzine today.  Time Spent: I spent >40 minutes face-to-face with the patient, with more than half spent on counseling for management of her chest pain, neck pain/back pain, hypertension, GERD, and insomnia.Katina Degree. Jimmey Ralph, MD 08/08/2017 5:28 PM

## 2017-08-08 NOTE — Telephone Encounter (Signed)
Pt. Reports she was "sliding some furniture over and I hurt my back." Reports she has pain and muscle spasms. Has been laying in bed with pillows under her knees and with a heating pad to back. States she has a history of "ruptured discs in my back." Appointment made for today. Reason for Disposition . [1] MODERATE back pain (e.g., interferes with normal activities) AND [2] present > 3 days  Answer Assessment - Initial Assessment Questions 1. ONSET: "When did the pain begin?"      Started yesterday 2. LOCATION: "Where does it hurt?" (upper, mid or lower back)     At her waistline 3. SEVERITY: "How bad is the pain?"  (e.g., Scale 1-10; mild, moderate, or severe)   - MILD (1-3): doesn't interfere with normal activities    - MODERATE (4-7): interferes with normal activities or awakens from sleep    - SEVERE (8-10): excruciating pain, unable to do any normal activities      7 4. PATTERN: "Is the pain constant?" (e.g., yes, no; constant, intermittent)      Constant with muscle spasms 5. RADIATION: "Does the pain shoot into your legs or elsewhere?"     Both legs into her knees 6. CAUSE:  "What do you think is causing the back pain?"      I bent to pick something up 7. BACK OVERUSE:  "Any recent lifting of heavy objects, strenuous work or exercise?"     Moving furniture 8. MEDICATIONS: "What have you taken so far for the pain?" (e.g., nothing, acetaminophen, NSAIDS)     Tylenol 9. NEUROLOGIC SYMPTOMS: "Do you have any weakness, numbness, or problems with bowel/bladder control?"     Weakness 10. OTHER SYMPTOMS: "Do you have any other symptoms?" (e.g., fever, abdominal pain, burning with urination, blood in urine)       No 11. PREGNANCY: "Is there any chance you are pregnant?" (e.g., yes, no; LMP)       No  Protocols used: BACK PAIN-A-AH

## 2017-08-08 NOTE — ED Notes (Signed)
Bed: WA20 Expected date:  Expected time:  Means of arrival:  Comments: TR 1 please

## 2017-08-08 NOTE — Patient Instructions (Signed)
Please go to the emergency department to make sure you are not having a heart attack.  I will refill your other medications.  Take care, Dr Jimmey Ralph

## 2017-08-08 NOTE — ED Triage Notes (Signed)
Pt sent from PCP after an "abnormal EKG." She was unable to report what he saw. She is now reporting L arm pain, but not much chest pain and general malaise. A&Ox4. Hx of MI.

## 2017-08-14 ENCOUNTER — Encounter: Payer: Self-pay | Admitting: Family Medicine

## 2017-08-16 ENCOUNTER — Telehealth: Payer: Self-pay | Admitting: Family Medicine

## 2017-08-16 NOTE — Telephone Encounter (Signed)
Patient dismissed from [facility] by Jacquiline Doe MD , effective Aug 14, 2017. Dismissal letter sent out by certified / registered mail.  daj

## 2017-08-16 NOTE — Telephone Encounter (Deleted)
The patient was returning a call and would like a call back  Please advise

## 2017-08-20 NOTE — Telephone Encounter (Signed)
error 

## 2017-08-26 ENCOUNTER — Ambulatory Visit: Payer: Medicare Other | Admitting: Family Medicine

## 2017-08-26 ENCOUNTER — Ambulatory Visit (INDEPENDENT_AMBULATORY_CARE_PROVIDER_SITE_OTHER): Payer: Medicare Other | Admitting: Family Medicine

## 2017-08-26 ENCOUNTER — Encounter: Payer: Self-pay | Admitting: Family Medicine

## 2017-08-26 VITALS — BP 104/78 | HR 88 | Temp 97.9°F | Resp 16 | Ht 65.0 in | Wt 175.0 lb

## 2017-08-26 DIAGNOSIS — R61 Generalized hyperhidrosis: Secondary | ICD-10-CM

## 2017-08-26 DIAGNOSIS — G894 Chronic pain syndrome: Secondary | ICD-10-CM

## 2017-08-26 DIAGNOSIS — G4700 Insomnia, unspecified: Secondary | ICD-10-CM

## 2017-08-26 DIAGNOSIS — M542 Cervicalgia: Secondary | ICD-10-CM

## 2017-08-26 DIAGNOSIS — R232 Flushing: Secondary | ICD-10-CM | POA: Diagnosis not present

## 2017-08-26 MED ORDER — OXYCODONE-ACETAMINOPHEN 5-325 MG PO TABS: 1.0000 | ORAL_TABLET | ORAL | 0 refills | Status: DC | PRN

## 2017-08-26 MED ORDER — CYCLOBENZAPRINE HCL 10 MG PO TABS: 10.0000 mg | ORAL_TABLET | Freq: Three times a day (TID) | ORAL | 0 refills | Status: DC | PRN

## 2017-08-26 MED ORDER — TRAZODONE HCL 100 MG PO TABS: 200.0000 mg | ORAL_TABLET | Freq: Every day | ORAL | 0 refills | Status: DC

## 2017-08-26 MED ORDER — PREDNISONE 20 MG PO TABS: 20.0000 mg | ORAL_TABLET | Freq: Every day | ORAL | 0 refills | Status: DC

## 2017-08-26 NOTE — Patient Instructions (Signed)
It was nice to see you today!  Please start the prednisone and flexeril.  We will check blood work today.  I will also place a referral to the pain clinic.   Take care, Dr Jimmey Ralph

## 2017-08-26 NOTE — Progress Notes (Signed)
    Subjective:  Melanie Jarvis is a 67 y.o. female who presents today for same-day appointment with a chief complaint of neck pain.   HPI:  Neck Pain, acute problem Started about 2 days ago. Hurts in left neck and radiates into left arm.  Patient rolled over in bed and thinks that this may have caused her symptoms.  Pain is been stable over the last 2 days.  No other obvious precipitating events.  She has chronic left arm weakness and numbness that is her baseline.  No treatments tried.  No obvious alleviating or aggravating factors.  No reported bowel or bladder incontinence.  Insomnia, established problem, stable On trazodone 100 mg daily.  Does not think this works well for her.  Night sweats/heat sensitivity, new problem Started several weeks ago.  Patient notes increased sensitivity to heat and feeling hot all the time.  Has also had a few episodes of night sweats.  ROS: Per HPI  PMH: She reports that she has never smoked. She has never used smokeless tobacco. She reports that she does not drink alcohol or use drugs.  Objective:  Physical Exam: BP 104/78   Pulse 88   Temp 97.9 F (36.6 C) (Oral)   Resp 16   Ht  (1.651 m)   Wt 175 lb (79.4 kg)   SpO2 98%   BMI 29.12 kg/m   Gen: NAD, resting comfortably CV: RRR with no murmurs appreciated Pulm: NWOB, CTAB with no crackles, wheezes, or rhonchi MSK:  -Neck: No deformities. Tender to palpation along paraspinal muscles. Limited lateral rotation bilaterally.  -Arm: No deformities. Strength 5/5 bilaterally. Sensation to light touch intact bilaterally.  -Legs: no deformities. Strength 5/5 bilaterally.  Skin: Warm, dry Neuro: Grossly normal, moves all extremities Psych: Normal affect and thought content  Assessment/Plan:  Neck Pain/Chronic Pain No red flag signs or symptoms. Start prednisone and flexeril as this has worked well for pt in the past for similar presentations. Pt asked for short supply of oxycodone today.  Pt's database was reviewed without red flags. Pt was informed that this is not a med that could be prescribed long term. Pt was referred to a pain med clinic - she has been on chronic narcotics in the past. Discussed return precautions and reasons to return to care.  Hot flashes/Night Sweats Unclear etiology. Check CBC, CMET, and TSH today.  Insomnia Increase trazodone to 100-200mg  nightly. Return precautions reviewed.   Katina Degree. Jimmey Ralph, MD 08/26/2017 3:45 PM

## 2017-08-27 LAB — COMPREHENSIVE METABOLIC PANEL
ALT: 11 U/L (ref 0–35)
AST: 14 U/L (ref 0–37)
Albumin: 4 g/dL (ref 3.5–5.2)
Alkaline Phosphatase: 80 U/L (ref 39–117)
BILIRUBIN TOTAL: 0.4 mg/dL (ref 0.2–1.2)
BUN: 14 mg/dL (ref 6–23)
CALCIUM: 9.6 mg/dL (ref 8.4–10.5)
CHLORIDE: 103 meq/L (ref 96–112)
CO2: 27 meq/L (ref 19–32)
CREATININE: 1.26 mg/dL — AB (ref 0.40–1.20)
GFR: 45 mL/min — ABNORMAL LOW (ref >60.00)
GLUCOSE: 91 mg/dL (ref 70–99)
Potassium: 4.2 mEq/L (ref 3.5–5.1)
SODIUM: 140 meq/L (ref 135–145)
Total Protein: 6.6 g/dL (ref 6.0–8.3)

## 2017-08-27 LAB — CBC
HCT: 44.3 % (ref 36.0–46.0)
Hemoglobin: 14.5 g/dL (ref 12.0–15.0)
MCHC: 32.8 g/dL (ref 30.0–36.0)
MCV: 83.4 fl (ref 78.0–100.0)
PLATELETS: 245 10*3/uL (ref 150.0–400.0)
RBC: 5.31 Mil/uL — ABNORMAL HIGH (ref 3.87–5.11)
RDW: 15 % (ref 11.5–15.5)
WBC: 10.7 10*3/uL — ABNORMAL HIGH (ref 4.0–10.5)

## 2017-08-27 LAB — TSH: TSH: 1.47 u[IU]/mL (ref 0.35–4.50)

## 2017-08-28 NOTE — Telephone Encounter (Signed)
Certified dismissal letter returned as undeliverable, unclaimed, return to sender after one attempt by [employer] on Aug 28, 2017. Resent the letter as certified / registered mail using the new address provided by the [employer]. daj

## 2017-08-29 ENCOUNTER — Telehealth: Payer: Self-pay

## 2017-08-29 NOTE — Telephone Encounter (Signed)
Copied from CRM [id number]. Topic: Inquiry >> Aug 29, 2017 11:39 AM Windy Kalata, NT wrote: Reason for CRM: patient is calling to get lab results from visit 08/26/17. Please advise.

## 2017-08-30 ENCOUNTER — Telehealth: Payer: Self-pay | Admitting: Family Medicine

## 2017-08-30 NOTE — Telephone Encounter (Signed)
Noted.   Melanie Jarvis. Jimmey Ralph, MD 08/30/2017 3:39 PM

## 2017-08-30 NOTE — Telephone Encounter (Signed)
Patient called in to talk about her dismissal letter that she received from Dr. Jimmey Ralph. She said that she was very upset that she received this letter and would like for it to be changed. She does not agree with the statement of non adherence of the controlled substance policy and would like that part of the letter removed.  She also didn't agree with non compliance of medical advise. She stated that she always did what Dr. Jimmey Ralph advised her to do. She said she understood that she had not shown up on several appointments and understood being dismissed for that reason. She went on to ask about her labwork and referral to pain management. She stated that she did not receive the message of labs on 08/27/17 because the wrong number had been called. I verified that the number on file was correct. She stated that it was. She said there was another number on file, I let her know that there was only one number that I could see. It may have already been updated by the [employer] agent that spoke to her before me. She started talking about the dismissal again and I went over the information that I had again.She then started talking about getting a lawyer to represent her because she could not have this in her medical record. I tried to explain to her the reasons that were given by Dr. Jimmey Ralph and sometimes provider/patient relationships are not a good fit. She asked about her referral and labwork again. I again made sure we had the correct phone number. She said that I had originally given her 2 numbers. I assured her that I had only ever saw the one I gave her. She told me that I was lying and that her husband heard me give her 2 phone numbers. I explained that I only ever had one phone number and that I was not going to continue that conversation.She wanted me to let Dr. Jimmey Ralph know that she appreciated the care that he had given her and wished that it would have worked out. She also wanted to remind him that Dr's could lose their  license for lying. And that she would be in touch with her lawyer Lollie Sails. I told her I would send a message and  I then asked her if she would like to talk to the clinical staff to get her lab work and transferred her to the CMA.

## 2017-08-30 NOTE — Telephone Encounter (Signed)
Notes recorded by Mare Loan, CMA on 08/27/2017 at 12:12 PM EDT LVM with results.  LVM today with same results

## 2017-09-04 NOTE — Telephone Encounter (Signed)
Received signed domestic return receipt verifying delivery of certified letter on Aug 30, 2017. Article number [id number] [id number] daj

## 2018-04-19 ENCOUNTER — Emergency Department (HOSPITAL_COMMUNITY): Payer: Medicare Other

## 2018-04-19 ENCOUNTER — Emergency Department (HOSPITAL_COMMUNITY)
Admission: EM | Admit: 2018-04-19 | Discharge: 2018-04-19 | Disposition: A | Discharge status: Home / Self Care | Payer: Medicare Other | Attending: Emergency Medicine | Admitting: Emergency Medicine

## 2018-04-19 ENCOUNTER — Encounter (HOSPITAL_COMMUNITY): Payer: Self-pay | Admitting: Emergency Medicine

## 2018-04-19 DIAGNOSIS — I2510 Atherosclerotic heart disease of native coronary artery without angina pectoris: Secondary | ICD-10-CM | POA: Diagnosis not present

## 2018-04-19 DIAGNOSIS — Z79899 Other long term (current) drug therapy: Secondary | ICD-10-CM | POA: Diagnosis not present

## 2018-04-19 DIAGNOSIS — Z91040 Latex allergy status: Secondary | ICD-10-CM | POA: Diagnosis not present

## 2018-04-19 DIAGNOSIS — E039 Hypothyroidism, unspecified: Secondary | ICD-10-CM | POA: Insufficient documentation

## 2018-04-19 DIAGNOSIS — N10 Acute tubulo-interstitial nephritis: Secondary | ICD-10-CM | POA: Diagnosis not present

## 2018-04-19 DIAGNOSIS — J45909 Unspecified asthma, uncomplicated: Secondary | ICD-10-CM | POA: Insufficient documentation

## 2018-04-19 DIAGNOSIS — R1031 Right lower quadrant pain: Secondary | ICD-10-CM | POA: Diagnosis present

## 2018-04-19 DIAGNOSIS — N12 Tubulo-interstitial nephritis, not specified as acute or chronic: Secondary | ICD-10-CM

## 2018-04-19 DIAGNOSIS — I252 Old myocardial infarction: Secondary | ICD-10-CM | POA: Insufficient documentation

## 2018-04-19 DIAGNOSIS — I10 Essential (primary) hypertension: Secondary | ICD-10-CM | POA: Insufficient documentation

## 2018-04-19 LAB — URINALYSIS, ROUTINE W REFLEX MICROSCOPIC
Bilirubin Urine: NEGATIVE
Glucose, UA: NEGATIVE mg/dL
Hgb urine dipstick: NEGATIVE
Ketones, ur: NEGATIVE mg/dL
NITRITE: NEGATIVE
Protein, ur: NEGATIVE mg/dL
SPECIFIC GRAVITY, URINE: 1.015 (ref 1.005–1.030)
pH: 5 (ref 5.0–8.0)

## 2018-04-19 LAB — COMPREHENSIVE METABOLIC PANEL
ALT: 12 U/L (ref 0–44)
AST: 15 U/L (ref 15–41)
Albumin: 4.3 g/dL (ref 3.5–5.0)
Alkaline Phosphatase: 80 U/L (ref 38–126)
Anion gap: 9 (ref 5–15)
BILIRUBIN TOTAL: 0.8 mg/dL (ref 0.3–1.2)
BUN: 13 mg/dL (ref 8–23)
CHLORIDE: 108 mmol/L (ref 98–111)
CO2: 25 mmol/L (ref 22–32)
CREATININE: 1.01 mg/dL — AB (ref 0.44–1.00)
Calcium: 9 mg/dL (ref 8.9–10.3)
GFR, EST NON AFRICAN AMERICAN: 58 mL/min — AB (ref >60)
Glucose, Bld: 140 mg/dL — ABNORMAL HIGH (ref 70–99)
Potassium: 3.8 mmol/L (ref 3.5–5.1)
Sodium: 142 mmol/L (ref 135–145)
Total Protein: 6.8 g/dL (ref 6.5–8.1)

## 2018-04-19 LAB — CBC WITH DIFFERENTIAL/PLATELET
Abs Immature Granulocytes: 0.03 10*3/uL (ref 0.00–0.07)
BASOS ABS: 0.1 10*3/uL (ref 0.0–0.1)
BASOS PCT: 1 %
Eosinophils Absolute: 0.1 10*3/uL (ref 0.0–0.5)
Eosinophils Relative: 1 %
HCT: 43.7 % (ref 36.0–46.0)
Hemoglobin: 13.7 g/dL (ref 12.0–15.0)
IMMATURE GRANULOCYTES: 0 %
LYMPHS PCT: 15 %
Lymphs Abs: 1.4 10*3/uL (ref 0.7–4.0)
MCH: 26.2 pg (ref 26.0–34.0)
MCHC: 31.4 g/dL (ref 30.0–36.0)
MCV: 83.6 fL (ref 80.0–100.0)
Monocytes Absolute: 0.4 10*3/uL (ref 0.1–1.0)
Monocytes Relative: 4 %
NRBC: 0 % (ref 0.0–0.2)
Neutro Abs: 7.6 10*3/uL (ref 1.7–7.7)
Neutrophils Relative %: 79 %
PLATELETS: 192 10*3/uL (ref 150–400)
RBC: 5.23 MIL/uL — ABNORMAL HIGH (ref 3.87–5.11)
RDW: 13.3 % (ref 11.5–15.5)
WBC: 9.5 10*3/uL (ref 4.0–10.5)

## 2018-04-19 LAB — LIPASE, BLOOD: LIPASE: 25 U/L (ref 11–51)

## 2018-04-19 MED ORDER — METOCLOPRAMIDE HCL 10 MG PO TABS: 10.0000 mg | ORAL_TABLET | Freq: Four times a day (QID) | ORAL | 0 refills | Status: DC | PRN

## 2018-04-19 MED ORDER — METOCLOPRAMIDE HCL 5 MG/ML IJ SOLN
5.0000 mg | Freq: Once | INTRAMUSCULAR | Status: AC
  Administered 2018-04-19: 5 mg via INTRAVENOUS
  Filled 2018-04-19: qty 2

## 2018-04-19 MED ORDER — FENTANYL CITRATE (PF) 100 MCG/2ML IJ SOLN
100.0000 ug | Freq: Once | INTRAMUSCULAR | Status: AC
  Administered 2018-04-19: 100 ug via INTRAVENOUS
  Filled 2018-04-19: qty 2

## 2018-04-19 MED ORDER — MORPHINE SULFATE (PF) 4 MG/ML IV SOLN
4.0000 mg | Freq: Once | INTRAVENOUS | Status: AC
  Administered 2018-04-19: 4 mg via INTRAVENOUS
  Filled 2018-04-19: qty 1

## 2018-04-19 MED ORDER — SODIUM CHLORIDE 0.9 % IV BOLUS
1000.0000 mL | Freq: Once | INTRAVENOUS | Status: AC
  Administered 2018-04-19: 1000 mL via INTRAVENOUS

## 2018-04-19 MED ORDER — CEPHALEXIN 500 MG PO CAPS: 500.0000 mg | ORAL_CAPSULE | Freq: Four times a day (QID) | ORAL | 0 refills | Status: DC

## 2018-04-19 MED ORDER — IOPAMIDOL (ISOVUE-300) INJECTION 61%
100.0000 mL | Freq: Once | INTRAVENOUS | Status: AC | PRN
  Administered 2018-04-19: 100 mL via INTRAVENOUS

## 2018-04-19 MED ORDER — IOPAMIDOL (ISOVUE-300) INJECTION 61%
INTRAVENOUS | Status: DC
Start: 2018-04-19 — End: 2018-04-20
  Filled 2018-04-19: qty 100

## 2018-04-19 MED ORDER — CEPHALEXIN 500 MG PO CAPS
500.0000 mg | ORAL_CAPSULE | Freq: Once | ORAL | Status: AC
  Administered 2018-04-19: 500 mg via ORAL
  Filled 2018-04-19: qty 1

## 2018-04-19 MED ORDER — SODIUM CHLORIDE (PF) 0.9 % IJ SOLN
INTRAMUSCULAR | Status: AC
  Filled 2018-04-19: qty 50

## 2018-04-19 NOTE — ED Triage Notes (Signed)
Pt reports was seen at [facility] and put on Macrobid for kidney infection and almost finished with them. Reports still having flank pain and dysuria.

## 2018-04-19 NOTE — Discharge Instructions (Signed)
Get your blood pressure rechecked at Dr. Dalia Heading office within the next 2 weeks.  Today's was elevated at 166/53.  Take Tylenol 650 mg every 4 hours as needed for pain you can take the medicine prescribed as needed for nausea.  Make sure you finish the antibiotic.  Return to the emergency department if you cannot hold down fluids or the medications prescribed after taking the antinausea medicine, or if you feel worse for any reason

## 2018-04-19 NOTE — ED Provider Notes (Signed)
[facility]-EMERGENCY DEPT Provider Note   CSN: [id number] Arrival date & time: 04/19/18  1853     History   Chief Complaint Chief Complaint  Patient presents with  . Flank Pain  . Dysuria    HPI Melanie Jarvis is a 68 y.o. female.  She complains of left flank pain started approximately 10 days ago.  Pain is since moved to right flank.  She was seen at an urgent care center 6 days ago started on Macrobid for "kidney infection" she was called 2 days ago and was told that urine culture showed resistance to Macrobid but she was not placed on a different antibiotic.  She was told to see another doctor for follow-up.  Other associated symptoms include nausea, no vomiting, diminished appetite.  No fever.  Denies dysuria no other associated symptoms.  HPI  Past Medical History:  Diagnosis Date  . Abscess    chronic abscess chest comes and goes and has test positive for MRSA-steroids triggers it  . Anxiety   . Arthritis   . Asthma    SEVERE  . 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 Complex tear of lateral meniscus of left knee as current injury   . Coronary artery disease   . DDD (degenerative disc disease), cervical   . DDD (degenerative disc disease), lumbar   . Depression   . DVT (deep venous thrombosis) (HCC)    multiple since age 68  . Dysrhythmia   . Fibromyalgia   . GERD (gastroesophageal reflux disease)   . H/O gastric bypass    only has 20%stomack-no nsaids-gi bleed  . H/O hiatal hernia   . Head injury    years ago-memory loss  . Headache(784.0)   . HNP (herniated nucleus pulposus)    multiple  . HOH (hard of hearing)   . Hx MRSA infection   . Hypertension   . Hypothyroidism   . Lupus (HCC)   . 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 Myocardial infarction (HCC)   . Scoliosis   . Shortness of breath   . Wears glasses     Patient Active Problem List   Diagnosis Date Noted  . Hyperglycemia 06/11/2017  .  Chronic pain syndrome 06/11/2017  . Insomnia 06/11/2017  . Hx MRSA infection   . Complex tear of lateral meniscus of left knee as current injury   . Abscess   . Lupus (HCC)   . HNP (herniated nucleus pulposus)   . DDD (degenerative disc disease), lumbar   . Scoliosis   . Head injury   . H/O gastric bypass   . DVT (deep venous thrombosis) (HCC)   . HOH (hard of hearing)   . Wears glasses   . Blood dyscrasia   . Fibromyalgia   . Arthritis   . Headache(784.0)   . H/O hiatal hernia   . Coronary artery disease   . Hypertension   . Dysrhythmia   . Hypothyroidism   . Depression   . GERD (gastroesophageal reflux disease)     Past Surgical History:  Procedure Laterality Date  . ABDOMINAL HYSTERECTOMY  1986  . APPENDECTOMY  1964  . CARDIAC CATHETERIZATION  2010  . FOOT NEUROMA SURGERY  1971   rt foot  . GASTRIC BYPASS  2010   done at the [facility]  . KNEE ARTHROSCOPY Left 08/12/2012   Procedure: ARTHROSCOPY KNEE, PARTIAL LATERAL MENISCECTOMY, CHONDROPLASTY;  Surgeon: Nilda Simmerobert A Wainer, MD;  Location: [facility];  Service: Orthopedics;  Laterality: Left;     OB History   No obstetric history on file.      Home Medications    Prior to Admission medications   Medication Sig Start Date End Date Taking? Authorizing Provider  cyclobenzaprine (FLEXERIL) 10 MG tablet Take 1 tablet (10 mg total) by mouth 3 (three) times daily as needed for muscle spasms. 08/26/17   Ardith Dark, MD  Glucose Blood (COOL BLOOD GLUCOSE TEST STRIPS VI) Blood Glucose Test strips  Take 1 strip every day by miscell. route.    [provider]  lansoprazole (PREVACID) 30 MG capsule Take 1 capsule (30 mg total) by mouth 2 (two) times daily. Takes 2 twice a day 08/08/17   Ardith Dark, MD  lisinopril-hydrochlorothiazide (PRINZIDE,ZESTORETIC) 20-25 MG tablet Take 1 tablet by mouth daily. 08/08/17   Ardith Dark, MD  ondansetron (ZOFRAN) 8 MG tablet Take 1 tablet (8 mg total) by  mouth every 8 (eight) hours as needed for nausea or vomiting. 06/11/17   Ardith Dark, MD  [facility] DELICA LANCETS 33G MISC OneTouch Delica Lancets 33 gauge    [provider]  oxyCODONE-acetaminophen (PERCOCET) 5-325 MG tablet Take 1-2 tablets by mouth every 4 (four) hours as needed for severe pain. 08/26/17   Ardith Dark, MD  predniSONE (DELTASONE) 20 MG tablet Take 1 tablet (20 mg total) by mouth daily with breakfast. 08/26/17   Ardith Dark, MD  rosuvastatin (CRESTOR) 10 MG tablet Take 1 tablet (10 mg total) by mouth 2 (two) times daily. Patient not taking: Reported on 08/08/2017 06/11/17   Ardith Dark, MD  tamsulosin (FLOMAX) 0.4 MG CAPS capsule Take 2 capsules (0.8 mg total) by mouth at bedtime. Patient not taking: Reported on 08/08/2017 06/11/17   Ardith Dark, MD  traZODone (DESYREL) 100 MG tablet Take 2 tablets (200 mg total) by mouth at bedtime. 08/26/17   Ardith Dark, MD  traZODone (DESYREL) 50 MG tablet Take 1-2 tablets (50-100 mg total) by mouth at bedtime as needed for sleep. 06/11/17   Ardith Dark, MD   Patient is not currently taking flomax, Crestor, prednisone, Zofran, lisinopril-HCTZ, or Flexeril.  She is taking benazepril 10 mg daily. Family History Family History  Problem Relation Age of Onset  . Diabetes Other   . Hypertension Other   . Heart disease Other   . Arthritis Other     Social History Social History   Tobacco Use  . Smoking status: Never Smoker  . Smokeless tobacco: Never Used  Substance Use Topics  . Alcohol use: No  . Drug use: No     Allergies   Antihistamines, chlorpheniramine-type; Beta adrenergic blockers; Erythromycin; Ginseng; Tetanus toxoid; Tetanus toxoids; Thallous chloride tl 201; 2-(ethylmercuriothio)benzoic acid; Ace inhibitors; Antazoline; Asa [aspirin]; Diltiazem; Latex; Piperazine; and Xarelto [rivaroxaban]   Review of Systems Review of Systems  Constitutional: Positive for appetite change.  HENT: Negative.     Respiratory: Negative.   Cardiovascular: Negative.   Gastrointestinal: Positive for abdominal pain and nausea.  Genitourinary: Positive for flank pain.  Skin: Negative.   Neurological: Negative.   Psychiatric/Behavioral: Negative.   All other systems reviewed and are negative.    Physical Exam Updated Vital Signs BP (!) 160/55 (BP Location: Left Arm)   Pulse (!) 57   Temp 98.4 F (36.9 C) (Oral)   Resp 18   SpO2 96%   Physical Exam Vitals signs and nursing note reviewed.  Constitutional:      Appearance: She is well-developed. She is not ill-appearing.  HENT:     Head: Normocephalic and atraumatic.  Eyes:     Conjunctiva/sclera: Conjunctivae normal.     Pupils: Pupils are equal, round, and reactive to light.  Neck:     Musculoskeletal: Neck supple.     Thyroid: No thyromegaly.     Trachea: No tracheal deviation.  Cardiovascular:     Rate and Rhythm: Normal rate and regular rhythm.     Heart sounds: No murmur.  Pulmonary:     Effort: Pulmonary effort is normal.     Breath sounds: Normal breath sounds.  Abdominal:     General: Bowel sounds are normal. There is no distension.     Palpations: Abdomen is soft.     Tenderness: There is abdominal tenderness. There is right CVA tenderness and left CVA tenderness. There is no guarding or rebound.     Hernia: No hernia is present.     Comments: Diffusely tender  Musculoskeletal: Normal range of motion.        General: No tenderness.  Skin:    General: Skin is warm and dry.     Findings: No rash.  Neurological:     Mental Status: She is alert.     Coordination: Coordination normal.      ED Treatments / Results  Labs (all labs ordered are listed, but only abnormal results are displayed) Labs Reviewed  COMPREHENSIVE METABOLIC PANEL  CBC WITH DIFFERENTIAL/PLATELET  URINALYSIS, ROUTINE W REFLEX MICROSCOPIC    EKG None  Radiology No results found.  Procedures Procedures (including critical care  time)  Medications Ordered in ED Medications  metoCLOPramide (REGLAN) injection 5 mg (has no administration in time range)  sodium chloride 0.9 % bolus 1,000 mL (has no administration in time range)  fentaNYL (SUBLIMAZE) injection 100 mcg (has no administration in time range)   8:25 PM pain not improved after treatment with intravenous fentanyl.  Nausea has improved after treatment with intravenous Reglan.  IV morphine ordered 10 PM she continues to complain of back pain.  She is mildly nauseated but has been able to drink without vomiting.  She feels ready to go home. Results for orders placed or performed during the hospital encounter of 04/19/18  Comprehensive metabolic panel  Result Value Ref Range   Sodium 142 135 - 145 mmol/L   Potassium 3.8 3.5 - 5.1 mmol/L   Chloride 108 98 - 111 mmol/L   CO2 25 22 - 32 mmol/L   Glucose, Bld 140 (H) 70 - 99 mg/dL   BUN 13 8 - 23 mg/dL   Creatinine, Ser 0.98 (H) 0.44 - 1.00 mg/dL   Calcium 9.0 8.9 - 11.9 mg/dL   Total Protein 6.8 6.5 - 8.1 g/dL   Albumin 4.3 3.5 - 5.0 g/dL   AST 15 15 - 41 U/L   ALT 12 0 - 44 U/L   Alkaline Phosphatase 80 38 - 126 U/L   Total Bilirubin 0.8 0.3 - 1.2 mg/dL   GFR calc non Af Amer 58 (L) >60 mL/min   GFR calc Af Amer >60 >60 mL/min   Anion gap 9 5 - 15  CBC with Differential/Platelet  Result Value Ref Range   WBC 9.5 4.0 - 10.5 K/uL   RBC 5.23 (H) 3.87 - 5.11 MIL/uL   Hemoglobin 13.7 12.0 - 15.0 g/dL   HCT 14.7 82.9 - 56.2 %   MCV 83.6 80.0 - 100.0 fL  MCH 26.2 26.0 - 34.0 pg   MCHC 31.4 30.0 - 36.0 g/dL   RDW 16.1 09.6 - 04.5 %   Platelets 192 150 - 400 K/uL   nRBC 0.0 0.0 - 0.2 %   Neutrophils Relative % 79 %   Neutro Abs 7.6 1.7 - 7.7 K/uL   Lymphocytes Relative 15 %   Lymphs Abs 1.4 0.7 - 4.0 K/uL   Monocytes Relative 4 %   Monocytes Absolute 0.4 0.1 - 1.0 K/uL   Eosinophils Relative 1 %   Eosinophils Absolute 0.1 0.0 - 0.5 K/uL   Basophils Relative 1 %   Basophils Absolute 0.1 0.0 - 0.1  K/uL   Immature Granulocytes 0 %   Abs Immature Granulocytes 0.03 0.00 - 0.07 K/uL  Urinalysis, Routine w reflex microscopic  Result Value Ref Range   Color, Urine YELLOW YELLOW   APPearance HAZY (A) CLEAR   Specific Gravity, Urine 1.015 1.005 - 1.030   pH 5.0 5.0 - 8.0   Glucose, UA NEGATIVE NEGATIVE mg/dL   Hgb urine dipstick NEGATIVE NEGATIVE   Bilirubin Urine NEGATIVE NEGATIVE   Ketones, ur NEGATIVE NEGATIVE mg/dL   Protein, ur NEGATIVE NEGATIVE mg/dL   Nitrite NEGATIVE NEGATIVE   Leukocytes, UA LARGE (A) NEGATIVE   RBC / HPF 0-5 0 - 5 RBC/hpf   WBC, UA 21-50 0 - 5 WBC/hpf   Bacteria, UA RARE (A) NONE SEEN   Squamous Epithelial / LPF 6-10 0 - 5   Mucus PRESENT    Hyaline Casts, UA PRESENT   Lipase, blood  Result Value Ref Range   Lipase 25 11 - 51 U/L   Ct Abdomen Pelvis W Contrast  Result Date: 04/19/2018 CLINICAL DATA:  Abdomen pain with bilateral flank pain EXAM: CT ABDOMEN AND PELVIS WITH CONTRAST TECHNIQUE: Multidetector CT imaging of the abdomen and pelvis was performed using the standard protocol following bolus administration of intravenous contrast. CONTRAST:  ISOVUE-300 IOPAMIDOL (ISOVUE-300) INJECTION 61% COMPARISON:  None. FINDINGS: Lower chest: Lung bases demonstrate patchy dependent atelectasis. No significant effusion. Small moderate hiatal hernia with postsurgical changes. Borderline to mild cardiomegaly. Hepatobiliary: No focal liver abnormality is seen. No gallstones, gallbladder wall thickening, or biliary dilatation. Pancreas: Unremarkable. No pancreatic ductal dilatation or surrounding inflammatory changes. Spleen: Normal in size without focal abnormality. Adrenals/Urinary Tract: Adrenal glands are unremarkable. Kidneys are normal, without renal calculi, focal lesion, or hydronephrosis. Bladder is unremarkable. Stomach/Bowel: Stomach is within normal limits. No evidence of bowel wall thickening, distention, or inflammatory changes. Vascular/Lymphatic:  Moderate aortic atherosclerosis. No aneurysm. No significantly enlarged lymph nodes. Reproductive: Status post hysterectomy. No adnexal masses. Other: Negative for free air or free fluid. Small fat in the umbilical region. Musculoskeletal: Scoliosis and degenerative changes of the spine. No acute or suspicious abnormality IMPRESSION: 1. No CT evidence for acute intra-abdominal or pelvic abnormality. 2. Small moderate hiatal hernia Electronically Signed   By: Jasmine Pang M.D.   On: 04/19/2018 21:39   Initial Impression / Assessment and Plan / ED Course  I have reviewed the triage vital signs and the nursing notes.  Pertinent labs & imaging results that were available during my care of the patient were reviewed by me and considered in my medical decision making (see chart for details).     Lab work remarkable for urinary tract infection otherwise normal.  Urine sent for culture.  I discussed case with hospital pharmacist.  Plan prescription Keflex 500 mg 4 times daily for 1 week.  Prescription Reglan.  Tylenol for pain .she will get first dose prior to discharge.  She is advised to get her blood pressure rechecked at PCPs office in 1 week.  Return precautions given for vomiting, or if she feels that her condition is worsening.  Final Clinical Impressions(s) / ED Diagnoses  Dx acute pyelonephritis #2 elevated blood pressure Final diagnoses:  None    ED Discharge Orders    None       Doug SouJacubowitz, Deyona Soza, MD 04/19/18 2207

## 2018-04-22 LAB — URINE CULTURE

## 2018-07-26 ENCOUNTER — Emergency Department (HOSPITAL_BASED_OUTPATIENT_CLINIC_OR_DEPARTMENT_OTHER): Payer: Medicare Other

## 2018-07-26 ENCOUNTER — Other Ambulatory Visit: Payer: Self-pay

## 2018-07-26 ENCOUNTER — Emergency Department (HOSPITAL_COMMUNITY)
Admission: EM | Admit: 2018-07-26 | Discharge: 2018-07-26 | Disposition: A | Discharge status: Home / Self Care | Payer: Medicare Other | Attending: Emergency Medicine | Admitting: Emergency Medicine

## 2018-07-26 ENCOUNTER — Encounter (HOSPITAL_COMMUNITY): Payer: Self-pay | Admitting: Emergency Medicine

## 2018-07-26 DIAGNOSIS — Z79899 Other long term (current) drug therapy: Secondary | ICD-10-CM | POA: Diagnosis not present

## 2018-07-26 DIAGNOSIS — I825Y2 Chronic embolism and thrombosis of unspecified deep veins of left proximal lower extremity: Secondary | ICD-10-CM

## 2018-07-26 DIAGNOSIS — I82592 Chronic embolism and thrombosis of other specified deep vein of left lower extremity: Secondary | ICD-10-CM | POA: Insufficient documentation

## 2018-07-26 DIAGNOSIS — I10 Essential (primary) hypertension: Secondary | ICD-10-CM | POA: Diagnosis not present

## 2018-07-26 DIAGNOSIS — I2510 Atherosclerotic heart disease of native coronary artery without angina pectoris: Secondary | ICD-10-CM | POA: Insufficient documentation

## 2018-07-26 DIAGNOSIS — I82409 Acute embolism and thrombosis of unspecified deep veins of unspecified lower extremity: Secondary | ICD-10-CM | POA: Diagnosis not present

## 2018-07-26 DIAGNOSIS — J45909 Unspecified asthma, uncomplicated: Secondary | ICD-10-CM | POA: Diagnosis not present

## 2018-07-26 DIAGNOSIS — M79662 Pain in left lower leg: Secondary | ICD-10-CM | POA: Diagnosis present

## 2018-07-26 LAB — CBC WITH DIFFERENTIAL/PLATELET
Abs Immature Granulocytes: 0.01 10*3/uL (ref 0.00–0.07)
Basophils Absolute: 0.1 10*3/uL (ref 0.0–0.1)
Basophils Relative: 1 %
Eosinophils Absolute: 0.1 10*3/uL (ref 0.0–0.5)
Eosinophils Relative: 1 %
HCT: 47.3 % — ABNORMAL HIGH (ref 36.0–46.0)
Hemoglobin: 15.2 g/dL — ABNORMAL HIGH (ref 12.0–15.0)
Immature Granulocytes: 0 %
Lymphocytes Relative: 21 %
Lymphs Abs: 1.5 10*3/uL (ref 0.7–4.0)
MCH: 27.2 pg (ref 26.0–34.0)
MCHC: 32.1 g/dL (ref 30.0–36.0)
MCV: 84.8 fL (ref 80.0–100.0)
Monocytes Absolute: 0.4 10*3/uL (ref 0.1–1.0)
Monocytes Relative: 6 %
Neutro Abs: 5.3 10*3/uL (ref 1.7–7.7)
Neutrophils Relative %: 71 %
Platelets: 201 10*3/uL (ref 150–400)
RBC: 5.58 MIL/uL — ABNORMAL HIGH (ref 3.87–5.11)
RDW: 14.6 % (ref 11.5–15.5)
WBC: 7.3 10*3/uL (ref 4.0–10.5)
nRBC: 0 % (ref 0.0–0.2)

## 2018-07-26 LAB — BASIC METABOLIC PANEL
Anion gap: 8 (ref 5–15)
BUN: 11 mg/dL (ref 8–23)
CO2: 25 mmol/L (ref 22–32)
Calcium: 9.3 mg/dL (ref 8.9–10.3)
Chloride: 106 mmol/L (ref 98–111)
Creatinine, Ser: 1.06 mg/dL — ABNORMAL HIGH (ref 0.44–1.00)
GFR calc Af Amer: >60 mL/min (ref >60)
GFR calc non Af Amer: 54 mL/min — ABNORMAL LOW (ref >60)
Glucose, Bld: 85 mg/dL (ref 70–99)
Potassium: 3.9 mmol/L (ref 3.5–5.1)
Sodium: 139 mmol/L (ref 135–145)

## 2018-07-26 MED ORDER — ENOXAPARIN SODIUM 80 MG/0.8ML ~~LOC~~ SOLN: 80.0000 mg | Freq: Two times a day (BID) | SUBCUTANEOUS | 0 refills | Status: DC

## 2018-07-26 MED ORDER — ENOXAPARIN SODIUM 80 MG/0.8ML ~~LOC~~ SOLN
80.0000 mg | Freq: Once | SUBCUTANEOUS | Status: AC
  Administered 2018-07-26: 80 mg via SUBCUTANEOUS
  Filled 2018-07-26: qty 0.8

## 2018-07-26 MED ORDER — HYDROCODONE-ACETAMINOPHEN 5-325 MG PO TABS
1.0000 | ORAL_TABLET | Freq: Once | ORAL | Status: AC
  Administered 2018-07-26: 1 via ORAL
  Filled 2018-07-26: qty 1

## 2018-07-26 NOTE — ED Notes (Signed)
US at bedside

## 2018-07-26 NOTE — Progress Notes (Signed)
LLE venous duplex       has been completed. Preliminary results can be found under CV proc through chart review. Julisa Flippo, BS, RDMS, RVT    

## 2018-07-26 NOTE — Discharge Instructions (Addendum)
We saw in the ER for leg pain.  Ultrasound does reveal a chronic blood clot.  We do not have old ultrasounds for comparison, therefore our oncologist recommends that we start you on Lovenox.  He would want you to be seen in their clinic.  Please contact the number provided if you do not hear back from them by Monday afternoon.  You will be provided with 12 days worth of prescription.  Make sure the oncologist prescribes you more medicine if needed.

## 2018-07-26 NOTE — ED Provider Notes (Addendum)
[facility]-EMERGENCY DEPT Provider Note   CSN: [id number] Arrival date & time: 07/26/18  1454    History   Chief Complaint Chief Complaint  Patient presents with   Leg Pain    HPI Melanie Jarvis is a 68 y.o. female.     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  Patient is not on any anticoagulant at this time.  She reports that last time she had a blood clot was around Christmas time when she went to [facility]  She was put on Lovenox for 3 months which she has completed.  Yesterday she started having left calf pain, this morning she woke up with pain in her groin region -similar to the pain she has had in the past when the DVT was expanding.  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  She reports multiple family members having clotting disorders.  Review of system is negative for chest pain, shortness of breath, dizziness, near fainting.  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   Complex tear of lateral meniscus of left knee as current injury    Coronary artery disease    DDD (degenerative disc disease), cervical    DDD (degenerative disc disease), lumbar    Depression    DVT (deep venous thrombosis) (HCC)    multiple since age 68   Dysrhythmia    Fibromyalgia    GERD (gastroesophageal reflux disease)    H/O gastric bypass    only has 20%stomack-no nsaids-gi bleed   H/O hiatal hernia    Head injury    years ago-memory loss   Headache(784.0)    HNP (herniated nucleus pulposus)    multiple   HOH (hard of hearing)    Hx MRSA infection    Hypertension    Hypothyroidism     Lupus (HCC)    MI (myocardial infarction) (HCC)    due to clots   Myocardial infarction (HCC)    Scoliosis    Shortness of breath    Wears glasses     Patient Active Problem List   Diagnosis Date Noted   Hyperglycemia 06/11/2017   Chronic pain syndrome 06/11/2017   Insomnia 06/11/2017   Hx MRSA infection    Complex tear of lateral meniscus of left knee as current injury    Abscess    Lupus (HCC)    HNP (herniated nucleus pulposus)    DDD (degenerative disc disease), lumbar    Scoliosis    Head injury    H/O gastric bypass    DVT (deep venous thrombosis) (HCC)    HOH (hard of hearing)    Wears glasses    Blood dyscrasia    Fibromyalgia    Arthritis    Headache(784.0)    H/O hiatal hernia    Coronary artery disease    Hypertension    Dysrhythmia    Hypothyroidism    Depression    GERD (gastroesophageal reflux disease)     Past Surgical History:  Procedure Laterality Date   ABDOMINAL HYSTERECTOMY  1986   APPENDECTOMY  1964  CARDIAC CATHETERIZATION  2010   FOOT NEUROMA SURGERY  1971   rt foot   GASTRIC BYPASS  2010   done at the [facility]   KNEE ARTHROSCOPY Left 08/12/2012   Procedure: ARTHROSCOPY KNEE, PARTIAL LATERAL MENISCECTOMY, CHONDROPLASTY;  Surgeon: Nilda Simmer, MD;  Location: [facility];  Service: Orthopedics;  Laterality: Left;     OB History   No obstetric history on file.      Home Medications    Prior to Admission medications   Medication Sig Start Date End Date Taking? Authorizing Provider  albuterol (VENTOLIN HFA) 108 (90 Base) MCG/ACT inhaler Inhale 2 puffs into the lungs 2 (two) times a day.   Yes [provider]  tetrahydrozoline-zinc (VISINE-AC) 0.05-0.25 % ophthalmic solution Place 2 drops into both eyes 2 (two) times a day.   Yes [provider]  cephALEXin (KEFLEX) 500 MG capsule Take 1 capsule (500 mg total) by mouth 4 (four) times daily. Patient not  taking: Reported on 07/26/2018 04/19/18   Doug Sou, MD  cyclobenzaprine (FLEXERIL) 10 MG tablet Take 1 tablet (10 mg total) by mouth 3 (three) times daily as needed for muscle spasms. Patient not taking: Reported on 07/26/2018 08/26/17   Ardith Dark, MD  Influenza vac split quadrivalent PF (FLUZONE HIGH-DOSE) 0.5 ML injection Fluzone High-Dose 2017-2018 (PF) 180 mcg/0.5 mL intramuscular syringe    [provider]  lansoprazole (PREVACID) 30 MG capsule Take 1 capsule (30 mg total) by mouth 2 (two) times daily. Takes 2 twice a day Patient not taking: Reported on 07/26/2018 08/08/17   Ardith Dark, MD  lisinopril-hydrochlorothiazide (PRINZIDE,ZESTORETIC) 20-25 MG tablet Take 1 tablet by mouth daily. Patient not taking: Reported on 07/26/2018 08/08/17   Ardith Dark, MD  metoCLOPramide (REGLAN) 10 MG tablet Take 1 tablet (10 mg total) by mouth every 6 (six) hours as needed for nausea. Patient not taking: Reported on 07/26/2018 04/19/18   Doug Sou, MD  ondansetron (ZOFRAN) 8 MG tablet Take 1 tablet (8 mg total) by mouth every 8 (eight) hours as needed for nausea or vomiting. Patient not taking: Reported on 07/26/2018 06/11/17   Ardith Dark, MD  oxyCODONE-acetaminophen (PERCOCET) 5-325 MG tablet Take 1-2 tablets by mouth every 4 (four) hours as needed for severe pain. Patient not taking: Reported on 07/26/2018 08/26/17   Ardith Dark, MD  predniSONE (DELTASONE) 20 MG tablet Take 1 tablet (20 mg total) by mouth daily with breakfast. Patient not taking: Reported on 07/26/2018 08/26/17   Ardith Dark, MD  rosuvastatin (CRESTOR) 10 MG tablet Take 1 tablet (10 mg total) by mouth 2 (two) times daily. Patient not taking: Reported on 07/26/2018 06/11/17   Ardith Dark, MD  tamsulosin (FLOMAX) 0.4 MG CAPS capsule Take 2 capsules (0.8 mg total) by mouth at bedtime. Patient not taking: Reported on 08/08/2017 06/11/17   Ardith Dark, MD  traZODone (DESYREL) 100 MG tablet Take 2 tablets (200  mg total) by mouth at bedtime. Patient not taking: Reported on 07/26/2018 08/26/17   Ardith Dark, MD  traZODone (DESYREL) 50 MG tablet Take 1-2 tablets (50-100 mg total) by mouth at bedtime as needed for sleep. Patient not taking: Reported on 07/26/2018 06/11/17   Ardith Dark, MD    Family History Family History  Problem Relation Age of Onset   Diabetes Other    Hypertension Other    Heart disease Other    Arthritis Other     Social History  Social History   Tobacco Use   Smoking status: Never Smoker   Smokeless tobacco: Never Used  Substance Use Topics   Alcohol use: No   Drug use: No     Allergies   Antihistamines, chlorpheniramine-type; Beta adrenergic blockers; Erythromycin; Ginseng; Tetanus toxoid; Tetanus toxoids; Thallous chloride tl 201; 2-(ethylmercuriothio)benzoic acid; Ace inhibitors; Antazoline; Diltiazem; Latex; Piperazine; and Xarelto [rivaroxaban]   Review of Systems Review of Systems  Constitutional: Positive for activity change.  Respiratory: Negative for shortness of breath.   Cardiovascular: Negative for chest pain.  Gastrointestinal: Negative for nausea and vomiting.  Allergic/Immunologic: Negative for immunocompromised state.  Hematological: Does not bruise/bleed easily.  All other systems reviewed and are negative.    Physical Exam Updated Vital Signs BP (!) 134/51 (BP Location: Right Arm)    Pulse 72    Temp 98.3 F (36.8 C) (Oral)    Resp 18    SpO2 98%   Physical Exam Vitals signs and nursing note reviewed.  Constitutional:      Appearance: She is well-developed.  HENT:     Head: Normocephalic and atraumatic.  Neck:     Musculoskeletal: Normal range of motion and neck supple.  Cardiovascular:     Rate and Rhythm: Normal rate.  Pulmonary:     Effort: Pulmonary effort is normal.  Abdominal:     General: Bowel sounds are normal.  Musculoskeletal:        General: Tenderness present.     Right lower leg: No edema.     Left  lower leg: No edema.     Comments: Patient has tenderness to palpation over the left posterior leg proximally.  She also has mild calf tenderness with palpation  Skin:    General: Skin is warm and dry.     Findings: Bruising present.  Neurological:     Mental Status: She is alert and oriented to person, place, and time.      ED Treatments / Results  Labs (all labs ordered are listed, but only abnormal results are displayed) Labs Reviewed  CBC WITH DIFFERENTIAL/PLATELET - Abnormal; Notable for the following components:      Result Value   RBC 5.58 (*)    Hemoglobin 15.2 (*)    HCT 47.3 (*)    All other components within normal limits  BASIC METABOLIC PANEL - Abnormal; Notable for the following components:   Creatinine, Ser 1.06 (*)    GFR calc non Af Amer 54 (*)    All other components within normal limits    EKG None  Radiology Vas [place] Lower Extremity Venous (dvt) (only [facility])  Result Date: 07/26/2018  Lower Venous Study Indications: Personal Hx DVT, bruising and pain.  Performing Technologist: Jeb LeveringJill Parker RDMS, RVT  Examination Guidelines: A complete evaluation includes B-mode imaging, spectral Doppler, color Doppler, and power Doppler as needed of all accessible portions of each vessel. Bilateral testing is considered an integral part of a complete examination. Limited examinations for reoccurring indications may be performed as noted.  +-----+---------------+---------+-----------+----------+-------+  RIGHT Compressibility Phasicity Spontaneity Properties Summary  +-----+---------------+---------+-----------+----------+-------+  CFV   Full            Yes       Yes                             +-----+---------------+---------+-----------+----------+-------+   +---------+---------------+---------+-----------+----------+-------+  LEFT      Compressibility Phasicity Spontaneity Properties Summary  +---------+---------------+---------+-----------+----------+-------+  CFV       Full             Yes       Yes                             +---------+---------------+---------+-----------+----------+-------+  SFJ       Full                                                      +---------+---------------+---------+-----------+----------+-------+  FV Prox   Full                                                      +---------+---------------+---------+-----------+----------+-------+  FV Mid    Full                                                      +---------+---------------+---------+-----------+----------+-------+  FV Distal Full                                                      +---------+---------------+---------+-----------+----------+-------+  PFV       Full                                                      +---------+---------------+---------+-----------+----------+-------+  POP       Partial                                          Chronic  +---------+---------------+---------+-----------+----------+-------+  PTV       Full                                                      +---------+---------------+---------+-----------+----------+-------+  PERO      Full                                                      +---------+---------------+---------+-----------+----------+-------+     Summary: Right: No evidence of common femoral vein obstruction. Left: Findings consistent with chronic deep vein thrombosis involving the left popliteal vein. No cystic structure found in the popliteal fossa. No evidence of acute DVT.  *See table(s) above for measurements and observations.    Preliminary     Procedures Procedures (including critical care  time)  Medications Ordered in ED Medications  enoxaparin (LOVENOX) injection 80 mg (has no administration in time range)  HYDROcodone-acetaminophen (NORCO/VICODIN) 5-325 MG per tablet 1 tablet (1 tablet Oral Given 07/26/18 1629)     Initial Impression / Assessment and Plan / ED Course  I have reviewed the triage vital signs and the nursing  notes.  Pertinent labs & imaging results that were available during my care of the patient were reviewed by me and considered in my medical decision making (see chart for details).  Clinical Course as of Jul 25 1745  Sat Jul 26, 2018  1746 Spoke with Dr. Dion Body, oncology. He recommends that patient get Lovenox 80 mg twice daily for her symptomatic clot and she will be followed up by their clinic.  Patient has been made aware of this plan.   [AN]    Clinical Course User Index [AN] Derwood Kaplan, MD       68 year old female comes in a chief complaint of leg pain.  She has known history of blood dyscrasia that puts her at risk for thrombosis.  She has had multiple DVTs in the past and has been put on temporary dosing of Lovenox.  She is not taking any antiplatelet agent at this time.  She comes in with pain similar to her prior DVTs.  Ultrasound DVT was completed, and it shows chronic thrombosis.  No clear evidence of active thrombosis.  Her pain is migrated proximally over the past 24 hours, leading with a question whether or of the clots might have migrated up.  Review of system is negative for any shortness of breath, chest pain.  I will discuss the case with hematology here to see if patient needs to be started on Lovenox and see him closely in the clinic.  Patient has moved to [facility] and wants to establish care here.  5:47 PM Ultrasound lower extremity DVT reveals chronic clot in the left popliteal vein.  Will discuss the case with oncology.  We do not have the ability to compare or review of prior DVT studies.  Final Clinical Impressions(s) / ED Diagnoses   Final diagnoses:  Chronic deep vein thrombosis (DVT) of proximal vein of left lower extremity [facility])    ED Discharge Orders    None       Derwood Kaplan, MD 07/26/18 1715    Derwood Kaplan, MD 07/26/18 1747

## 2018-07-26 NOTE — ED Triage Notes (Signed)
Patient here from home with complaints of left calf pain. Reports hx of DVTs. Denies blood thinners.

## 2018-07-26 NOTE — ED Notes (Signed)
Dark green and blue tube sent with ordered labs.

## 2018-07-26 NOTE — ED Notes (Signed)
Bed: WA01 Expected date:  Expected time:  Means of arrival:  Comments: 

## 2018-07-28 ENCOUNTER — Telehealth: Payer: Self-pay | Admitting: Hematology

## 2018-07-28 NOTE — Telephone Encounter (Signed)
Appointments scheduled LMVM with date/time/location/phone number to call if needed per 4/19 staff message

## 2018-07-31 ENCOUNTER — Other Ambulatory Visit: Payer: Self-pay | Admitting: Hematology

## 2018-07-31 DIAGNOSIS — I82409 Acute embolism and thrombosis of unspecified deep veins of unspecified lower extremity: Secondary | ICD-10-CM

## 2018-07-31 NOTE — Progress Notes (Signed)
[facility] CONSULT NOTE  Patient Care Team: Patient, No Pcp Per as PCP - General (General Practice)  HEM/ONC PROBLEM List: 1. Chronic left popliteal DVT -Hx of questionable superficial thrombophleibitis vs. DVT (no confirmed imaging reports); evaluation by hematology at [facility] in 2015 did not identify any major VTE event or hypercoagulable condition -07/2018: doppler for L calf pain at [facility] showed a chronic DVT involving the left popliteal vein   PERTINENT NON-HEM/ONC PROBLEMS: 1. 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 She was seen by Dr. Tobias Alexander of hematology at [facility] in 2005 and again 2015. In his extensive note, he documented several questionable events of venous thromboses; unfortunately, there is no formal imaging report that confirms any DVT in the lower extremities. The patient uses "phlebitis" interchangeably with "DVT", making it difficult to determine if these events represented superficial vs. deep venous thrombosis. However, for most of these events, she was treated with short durations of anticoagulation (usually a few weeks) or the "clots were surgically removed," which is inconsistent with standard management of DVT. Pt reports being recently treated for DVT in the lower extremity at [facility] in 03/2018, but there was no documentation of ER or clinic visit, or doppler study in Care Everywhere. She then thought it might have been in [place], but she was not certain. Furthermore, patient had several PCP visits at [facility] since 03/2018, and none of the  notes mentioned the patient having had DVT or being on Lovenox. Doppler in the ER at [facility] in 07/2018 showed a chronic left popliteal DVT without any clear acute thrombosis, and she was discharged from the ER with therapeutic Lovenox due to symptoms related to the chronic DVT.  -I reviewed the results in detail with the patient -I explained due to her that despite reviewing her records very extensively from [place], [place], [facility] and [facility], I could not establish a clear history of recurrent DVT, at least since 2015, when she was last seen by hematology at [facility]  -She has a history of mildly elevated factor levels in early 2000's, which is likely reactive, especially when there are multiple factors involved rather than individual coagulation factor -If the patient's reported DVT in 03/2018 is true, and that she completed 3 months of therapeutic Lovenox, then she has completed an appropriate duration of anticoagulation, and that the residual chronic DVT in the left popliteal vein would not require further treatment -However, given her self-reported symptoms of increasing left calf pain and swelling, we cannot definitely rule out any progression of the DVT, as we do not have any imaging study for comparison -To ere on the side of caution, I discussed with the patient about continuing therapeutic anticoagulation for another 3 months, recognizing that there is an increased risk of bleeding/bruising with anticoagulation -Patient expressed understanding and agreed with the plan -As she had a history of hematoma with Lovenox injection, I discussed with patient about changing the anticoagulant to Eliquis -After lengthy discussions of some of the benefits and risks of Eliquis, including the increased risk of bleeding and bruising, the patient agreed to transition  to Eliquis -I have ordered Eliquis starter pack to start on 08/02/2018; patient will stop Lovenox on the day she starts Eliquis (no overlap) -I will  see her in 3 weeks to assess for Eliquis tolerance, and if she does well, I will prescribe an additional 2 months of Eliquis 5mg  BID -Once she completes 3 months of anticoagulation, we can consider reducing the dose to 2.5mg  BID for secondary prophylaxis, given her questionable history of phlebitis vs. DVT -While not routinely indicated, a follow-up doppler in 3 months may be helpful, given the sometimes inconsistent correlation between the patient's symptoms and imaging results -Finally, I reinforced the importance of preventive strategies such as avoiding hormonal supplement, avoiding cigarette smoking, keeping up-to-date with screening programs for early cancer detection, frequent ambulation for long distance travel and aggressive DVT prophylaxis in all surgical settings. -Should she need any interruption of the anticoagulation for elective procedures in the future, feel free to contact me regarding peri-operative management.  No orders of the defined types were placed in this encounter.  A total of more than 60 minutes were spent face-to-face with the patient during this encounter and over half of that time was spent on counseling and coordination of care as outlined above.    All questions were answered. The patient knows to call the clinic with any problems, questions or concerns.  Return in 3 weeks for clinic follow-up ([place]).   Melanie Holms, MD 08/01/2018 12:53 PM   CHIEF COMPLAINTS/PURPOSE OF CONSULTATION:  "The Lovenox shots are tough on me"  HISTORY OF PRESENTING ILLNESS:  Melanie Jarvis is a 68 yo WF with history of CAD complicated by MI, HTN and DD who presents to hematology for evaluation of questionable history of recurrent DVT. Patient is a poor historian, and some of the history regarding her venous thromboses was obtained from a hematologic clinic note at [facility] in 2015. Patient reports that she has had multiple episodes of "phlebitis" in the past, which she uses interchangeably with  "DVT." Each episode was treated somewhat differently, including short courses of anticoagulation (usually a few weeks) to "surgical removal" of the clots. She reports that she was most recently treated for DVT in the LLE at [facility] in 03/2018 after she presented with left lower extremity pain, and was given therapeutic Lovenox x 3 months, but there is no documentation of the encounter, or mention of being on SubQ Lovenox injection in several subsequent PCP clinic notes at [facility].  Patient presented to [facility] ER in mid-07/2018 for increasing left lower extremity pain, which she attributes to worsening blood clot. Doppler in the ER showed a chronic DVT in the left popliteal vein, but the exact chronicity of the blood clot could not be determined, as there was no imaging for comparison, either internally or externally. Due to her reported symptoms associated with the DVT, she was discharged from the ER with therapeutic Lovenox 1mg /kg BID and instructed to follow up with hematology at [facility] for further evaluation and management.    Patient reports that since she restarted Lovenox injection, she has been tolerated the relatively well and denies any abnormal bleeding, such as hematemesis, hemoptysis, hematuria, hematochezia, melena, or epistaxis.  However, she does report "knots" in her abdomen after 2 to 3 days of injection of Lovenox, and she has history of subcutaneous hematomas from previous Lovenox injections in the past.  She is up-to-date with her cancer screening, including colonoscopy and mammogram at [facility].  She does not require Pap smear, she had  a complete hysterectomy in 1986 for uterine fibroids.  She denies any personal history of cancer.  She reports a family history of thromboembolic disease, but there has been no confirmation of any hypercoagulable condition.  MEDICAL HISTORY:  Past Medical History:  Diagnosis Date  . Abscess    chronic abscess chest comes and goes and has test positive for  MRSA-steroids triggers it  . Anxiety   . Arthritis   . Asthma    SEVERE  . 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 Complex tear of lateral meniscus of left knee as current injury   . Coronary artery disease   . DDD (degenerative disc disease), cervical   . DDD (degenerative disc disease), lumbar   . Depression   . DVT (deep venous thrombosis) (HCC)    multiple since age 26  . Dysrhythmia   . Fibromyalgia   . GERD (gastroesophageal reflux disease)   . H/O gastric bypass    only has 20%stomack-no nsaids-gi bleed  . H/O hiatal hernia   . Head injury    years ago-memory loss  . Headache(784.0)   . HNP (herniated nucleus pulposus)    multiple  . HOH (hard of hearing)   . Hx MRSA infection   . Hypertension   . Hypothyroidism   . Lupus (HCC)   . 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 Myocardial infarction (HCC)   . Scoliosis   . Shortness of breath   . Wears glasses     SURGICAL HISTORY: Past Surgical History:  Procedure Laterality Date  . ABDOMINAL HYSTERECTOMY  1986  . APPENDECTOMY  1964  . CARDIAC CATHETERIZATION  2010  . FOOT NEUROMA SURGERY  1971   rt foot  . GASTRIC BYPASS  2010   done at the [facility]  . KNEE ARTHROSCOPY Left 08/12/2012   Procedure: ARTHROSCOPY KNEE, PARTIAL LATERAL MENISCECTOMY, CHONDROPLASTY;  Surgeon: Nilda Simmer, MD;  Location: [facility];  Service: Orthopedics;  Laterality: Left;    SOCIAL HISTORY: Social History   Socioeconomic History  . Marital status: Married    Spouse name: Not on file  . Number of children: Not on file  . Years of education: Not on file  . Highest education level: Not on file  Occupational History  . Not on file  Social Needs  . Financial resource strain: Not on file  . Food insecurity:    Worry: Not on file    Inability: Not on file  . Transportation needs:    Medical: Not on file    Non-medical: Not on file  Tobacco Use  . Smoking status: Never Smoker   . Smokeless tobacco: Never Used  Substance and Sexual Activity  . Alcohol use: No  . Drug use: No  . Sexual activity: Not on file  Lifestyle  . Physical activity:    Days per week: Not on file    Minutes per session: Not on file  . Stress: Not on file  Relationships  . Social connections:    Talks on phone: Not on file    Gets together: Not on file    Attends religious service: Not on file    Active member of club or organization: Not on file    Attends meetings of clubs or organizations: Not on file    Relationship status: Not on file  . Intimate partner violence:    Fear of current or ex partner: Not on file  Emotionally abused: Not on file    Physically abused: Not on file    Forced sexual activity: Not on file  Other Topics Concern  . Not on file  Social History Narrative  . Not on file    FAMILY HISTORY: Family History  Problem Relation Age of Onset  . Diabetes Other   . Hypertension Other   . Heart disease Other   . Arthritis Other     ALLERGIES:  is allergic to 2-(ethylmercuriothio)benzoic acid; antazoline; antihistamines, chlorpheniramine-type; beta adrenergic blockers; diltiazem; erythromycin; ginseng; latex; piperazine; tetanus toxoid; tetanus toxoids; thallous chloride tl 201; xarelto [rivaroxaban]; and ace inhibitors.  MEDICATIONS:  Current Outpatient Medications  Medication Sig Dispense Refill  . albuterol (VENTOLIN HFA) 108 (90 Base) MCG/ACT inhaler Inhale 2 puffs into the lungs 2 (two) times a day.    . enoxaparin (LOVENOX) 80 MG/0.8ML injection Inject 0.8 mLs (80 mg total) into the skin every 12 (twelve) hours for 12 days. 22.4 Syringe 0  . lansoprazole (PREVACID) 30 MG capsule Take 1 capsule (30 mg total) by mouth 2 (two) times daily. Takes 2 twice a day 30 capsule 2  . lisinopril-hydrochlorothiazide (PRINZIDE,ZESTORETIC) 20-25 MG tablet Take 1 tablet by mouth daily. 30 tablet 2  . traZODone (DESYREL) 100 MG tablet Take 2 tablets (200 mg total) by  mouth at bedtime. 60 tablet 0  . cyclobenzaprine (FLEXERIL) 10 MG tablet Take 1 tablet (10 mg total) by mouth 3 (three) times daily as needed for muscle spasms. (Patient not taking: Reported on 07/26/2018) 30 tablet 0  . Eliquis DVT/PE Starter Pack (ELIQUIS STARTER PACK) 5 MG TABS Take as directed on package: start with two-5mg  tablets twice daily for 7 days. On day 8, switch to one-5mg  tablet twice daily. 1 each 0  . metoCLOPramide (REGLAN) 10 MG tablet Take 1 tablet (10 mg total) by mouth every 6 (six) hours as needed for nausea. (Patient not taking: Reported on 07/26/2018) 16 tablet 0  . ondansetron (ZOFRAN) 8 MG tablet Take 1 tablet (8 mg total) by mouth every 8 (eight) hours as needed for nausea or vomiting. (Patient not taking: Reported on 07/26/2018) 20 tablet 2  . rosuvastatin (CRESTOR) 10 MG tablet Take 1 tablet (10 mg total) by mouth 2 (two) times daily. (Patient not taking: Reported on 07/26/2018) 30 tablet 2  . tamsulosin (FLOMAX) 0.4 MG CAPS capsule Take 2 capsules (0.8 mg total) by mouth at bedtime. (Patient not taking: Reported on 08/08/2017) 30 capsule 2  . tetrahydrozoline-zinc (VISINE-AC) 0.05-0.25 % ophthalmic solution Place 2 drops into both eyes 2 (two) times a day.     No current facility-administered medications for this visit.     REVIEW OF SYSTEMS:   Constitutional: ( - ) fevers, ( - )  chills , ( - ) night sweats Eyes: ( - ) blurriness of vision, ( - ) double vision, ( - ) watery eyes Ears, nose, mouth, throat, and face: ( - ) mucositis, ( - ) sore throat Respiratory: ( - ) cough, ( - ) dyspnea, ( - ) wheezes Cardiovascular: ( - ) palpitation, ( - ) chest discomfort, ( - ) lower extremity swelling Gastrointestinal:  ( - ) nausea, ( - ) heartburn, ( - ) change in bowel habits Skin: ( - ) abnormal skin rashes Lymphatics: ( - ) new lymphadenopathy, ( + ) easy bruising Neurological: ( - ) numbness, ( - ) tingling, ( - ) new weaknesses Behavioral/Psych: ( - ) mood change, ( - )  new  changes  All other systems were reviewed with the patient and are negative.  PHYSICAL EXAMINATION: ECOG PERFORMANCE STATUS: 1 - Symptomatic but completely ambulatory  Vitals:   08/01/18 1213  BP: (!) 120/58  Pulse: 63  Resp: 18  Temp: 97.7 F (36.5 C)  SpO2: 100%   Filed Weights   08/01/18 1213  Weight: 152 lb 1.9 oz (69 kg)    GENERAL: alert, no distress and comfortable SKIN: a few small areas of bruises over the bilateral arms, venous insufficiency in the bilateral lower extremities  EYES: conjunctiva are pink and non-injected, sclera clear OROPHARYNX: no exudate, no erythema; lips, buccal mucosa, and tongue normal  NECK: supple, non-tender LUNGS: clear to auscultation with normal breathing effort HEART: regular rate & rhythm, no murmurs, no lower extremity edema ABDOMEN: soft, non-tender, non-distended, normal bowel sounds Musculoskeletal: no cyanosis of digits and no clubbing  PSYCH: alert & oriented x 3, fluent speech NEURO: no focal motor/sensory deficits  LABORATORY DATA:  I have reviewed the data as listed Lab Results  Component Value Date   WBC 8.2 08/01/2018   HGB 14.8 08/01/2018   HCT 45.7 08/01/2018   MCV 84.2 08/01/2018   PLT 204 08/01/2018   Lab Results  Component Value Date   NA 142 08/01/2018   K 4.4 08/01/2018   CL 104 08/01/2018   CO2 29 08/01/2018    RADIOGRAPHIC STUDIES: I have personally reviewed the radiological images as listed and agreed with the findings in the report. Vas [place] Lower Extremity Venous (dvt) (only [facility])  Result Date: 07/27/2018  Lower Venous Study Indications: Personal Hx DVT, bruising and pain.  Performing Technologist: Jeb Levering RDMS, RVT  Examination Guidelines: A complete evaluation includes B-mode imaging, spectral Doppler, color Doppler, and power Doppler as needed of all accessible portions of each vessel. Bilateral testing is considered an integral part of a complete examination. Limited examinations for  reoccurring indications may be performed as noted.  +-----+---------------+---------+-----------+----------+-------+ RIGHTCompressibilityPhasicitySpontaneityPropertiesSummary +-----+---------------+---------+-----------+----------+-------+ CFV  Full           Yes      Yes                          +-----+---------------+---------+-----------+----------+-------+   +---------+---------------+---------+-----------+----------+-------+ LEFT     CompressibilityPhasicitySpontaneityPropertiesSummary +---------+---------------+---------+-----------+----------+-------+ CFV      Full           Yes      Yes                          +---------+---------------+---------+-----------+----------+-------+ SFJ      Full                                                 +---------+---------------+---------+-----------+----------+-------+ FV Prox  Full                                                 +---------+---------------+---------+-----------+----------+-------+ FV Mid   Full                                                 +---------+---------------+---------+-----------+----------+-------+  FV DistalFull                                                 +---------+---------------+---------+-----------+----------+-------+ PFV      Full                                                 +---------+---------------+---------+-----------+----------+-------+ POP      Partial                                      Chronic +---------+---------------+---------+-----------+----------+-------+ PTV      Full                                                 +---------+---------------+---------+-----------+----------+-------+ PERO     Full                                                 +---------+---------------+---------+-----------+----------+-------+     Summary: Right: No evidence of common femoral vein obstruction. Left: Findings consistent with chronic deep vein  thrombosis involving the left popliteal vein. No cystic structure found in the popliteal fossa. No evidence of acute DVT.  *See table(s) above for measurements and observations. Electronically signed by Lemar LivingsBrandon Cain MD on 07/27/2018 at 8:52:55 AM.    Final     PATHOLOGY: I have reviewed the pathology reports as documented in the oncologist history.

## 2018-08-01 ENCOUNTER — Inpatient Hospital Stay: Payer: Medicare Other | Attending: Hematology

## 2018-08-01 ENCOUNTER — Other Ambulatory Visit: Payer: Self-pay

## 2018-08-01 ENCOUNTER — Inpatient Hospital Stay (HOSPITAL_BASED_OUTPATIENT_CLINIC_OR_DEPARTMENT_OTHER): Payer: Medicare Other | Admitting: Hematology

## 2018-08-01 ENCOUNTER — Encounter: Payer: Self-pay | Admitting: Hematology

## 2018-08-01 VITALS — BP 120/58 | HR 63 | Temp 97.7°F | Resp 18 | Ht 65.0 in | Wt 152.1 lb

## 2018-08-01 DIAGNOSIS — I10 Essential (primary) hypertension: Secondary | ICD-10-CM

## 2018-08-01 DIAGNOSIS — I82409 Acute embolism and thrombosis of unspecified deep veins of unspecified lower extremity: Secondary | ICD-10-CM

## 2018-08-01 DIAGNOSIS — I82532 Chronic embolism and thrombosis of left popliteal vein: Secondary | ICD-10-CM

## 2018-08-01 DIAGNOSIS — Z7901 Long term (current) use of anticoagulants: Secondary | ICD-10-CM

## 2018-08-01 DIAGNOSIS — M797 Fibromyalgia: Secondary | ICD-10-CM

## 2018-08-01 DIAGNOSIS — I252 Old myocardial infarction: Secondary | ICD-10-CM | POA: Diagnosis not present

## 2018-08-01 DIAGNOSIS — M5136 Other intervertebral disc degeneration, lumbar region: Secondary | ICD-10-CM | POA: Insufficient documentation

## 2018-08-01 DIAGNOSIS — Z79899 Other long term (current) drug therapy: Secondary | ICD-10-CM | POA: Insufficient documentation

## 2018-08-01 DIAGNOSIS — Z9884 Bariatric surgery status: Secondary | ICD-10-CM | POA: Insufficient documentation

## 2018-08-01 DIAGNOSIS — Z8614 Personal history of Methicillin resistant Staphylococcus aureus infection: Secondary | ICD-10-CM | POA: Diagnosis not present

## 2018-08-01 DIAGNOSIS — E039 Hypothyroidism, unspecified: Secondary | ICD-10-CM

## 2018-08-01 DIAGNOSIS — I2510 Atherosclerotic heart disease of native coronary artery without angina pectoris: Secondary | ICD-10-CM | POA: Insufficient documentation

## 2018-08-01 DIAGNOSIS — J45909 Unspecified asthma, uncomplicated: Secondary | ICD-10-CM | POA: Insufficient documentation

## 2018-08-01 DIAGNOSIS — M329 Systemic lupus erythematosus, unspecified: Secondary | ICD-10-CM

## 2018-08-01 DIAGNOSIS — F329 Major depressive disorder, single episode, unspecified: Secondary | ICD-10-CM | POA: Insufficient documentation

## 2018-08-01 DIAGNOSIS — K219 Gastro-esophageal reflux disease without esophagitis: Secondary | ICD-10-CM | POA: Diagnosis not present

## 2018-08-01 LAB — CMP (CANCER CENTER ONLY)
ALT: 10 U/L (ref 0–44)
AST: 14 U/L — ABNORMAL LOW (ref 15–41)
Albumin: 4.5 g/dL (ref 3.5–5.0)
Alkaline Phosphatase: 79 U/L (ref 38–126)
Anion gap: 9 (ref 5–15)
BUN: 18 mg/dL (ref 8–23)
CO2: 29 mmol/L (ref 22–32)
Calcium: 10.1 mg/dL (ref 8.9–10.3)
Chloride: 104 mmol/L (ref 98–111)
Creatinine: 1.34 mg/dL — ABNORMAL HIGH (ref 0.44–1.00)
GFR, Est AFR Am: 47 mL/min — ABNORMAL LOW (ref >60)
GFR, Estimated: 41 mL/min — ABNORMAL LOW (ref >60)
Glucose, Bld: 89 mg/dL (ref 70–99)
Potassium: 4.4 mmol/L (ref 3.5–5.1)
Sodium: 142 mmol/L (ref 135–145)
Total Bilirubin: 0.5 mg/dL (ref 0.3–1.2)
Total Protein: 6.8 g/dL (ref 6.5–8.1)

## 2018-08-01 LAB — CBC WITH DIFFERENTIAL (CANCER CENTER ONLY)
Abs Immature Granulocytes: 0.01 10*3/uL (ref 0.00–0.07)
Basophils Absolute: 0.1 10*3/uL (ref 0.0–0.1)
Basophils Relative: 1 %
Eosinophils Absolute: 0.1 10*3/uL (ref 0.0–0.5)
Eosinophils Relative: 1 %
HCT: 45.7 % (ref 36.0–46.0)
Hemoglobin: 14.8 g/dL (ref 12.0–15.0)
Immature Granulocytes: 0 %
Lymphocytes Relative: 24 %
Lymphs Abs: 2 10*3/uL (ref 0.7–4.0)
MCH: 27.3 pg (ref 26.0–34.0)
MCHC: 32.4 g/dL (ref 30.0–36.0)
MCV: 84.2 fL (ref 80.0–100.0)
Monocytes Absolute: 0.5 10*3/uL (ref 0.1–1.0)
Monocytes Relative: 6 %
Neutro Abs: 5.6 10*3/uL (ref 1.7–7.7)
Neutrophils Relative %: 68 %
Platelet Count: 204 10*3/uL (ref 150–400)
RBC: 5.43 MIL/uL — ABNORMAL HIGH (ref 3.87–5.11)
RDW: 14.3 % (ref 11.5–15.5)
WBC Count: 8.2 10*3/uL (ref 4.0–10.5)
nRBC: 0 % (ref 0.0–0.2)

## 2018-08-01 LAB — SAVE SMEAR (SSMR)

## 2018-08-01 MED ORDER — ELIQUIS 5 MG VTE STARTER PACK: ORAL_TABLET | ORAL | 0 refills | Status: DC

## 2018-08-21 ENCOUNTER — Telehealth: Payer: Self-pay | Admitting: *Deleted

## 2018-08-21 ENCOUNTER — Other Ambulatory Visit: Payer: Self-pay

## 2018-08-21 ENCOUNTER — Encounter (HOSPITAL_COMMUNITY): Payer: Self-pay

## 2018-08-21 ENCOUNTER — Other Ambulatory Visit: Payer: Self-pay | Admitting: Hematology

## 2018-08-21 ENCOUNTER — Emergency Department (HOSPITAL_COMMUNITY): Payer: Medicare Other

## 2018-08-21 ENCOUNTER — Emergency Department (HOSPITAL_COMMUNITY)
Admission: EM | Admit: 2018-08-21 | Discharge: 2018-08-21 | Disposition: A | Discharge status: Home / Self Care | Payer: Medicare Other | Attending: Emergency Medicine | Admitting: Emergency Medicine

## 2018-08-21 DIAGNOSIS — J45909 Unspecified asthma, uncomplicated: Secondary | ICD-10-CM | POA: Diagnosis not present

## 2018-08-21 DIAGNOSIS — Z7901 Long term (current) use of anticoagulants: Secondary | ICD-10-CM | POA: Diagnosis not present

## 2018-08-21 DIAGNOSIS — E039 Hypothyroidism, unspecified: Secondary | ICD-10-CM | POA: Diagnosis not present

## 2018-08-21 DIAGNOSIS — Y999 Unspecified external cause status: Secondary | ICD-10-CM | POA: Diagnosis not present

## 2018-08-21 DIAGNOSIS — W2209XA Striking against other stationary object, initial encounter: Secondary | ICD-10-CM | POA: Diagnosis not present

## 2018-08-21 DIAGNOSIS — Y93H2 Activity, gardening and landscaping: Secondary | ICD-10-CM | POA: Diagnosis not present

## 2018-08-21 DIAGNOSIS — Y92007 Garden or yard of unspecified non-institutional (private) residence as the place of occurrence of the external cause: Secondary | ICD-10-CM | POA: Diagnosis not present

## 2018-08-21 DIAGNOSIS — I10 Essential (primary) hypertension: Secondary | ICD-10-CM | POA: Diagnosis not present

## 2018-08-21 DIAGNOSIS — S060X1A Concussion with loss of consciousness of 30 minutes or less, initial encounter: Secondary | ICD-10-CM

## 2018-08-21 DIAGNOSIS — Z91040 Latex allergy status: Secondary | ICD-10-CM | POA: Insufficient documentation

## 2018-08-21 DIAGNOSIS — Z79899 Other long term (current) drug therapy: Secondary | ICD-10-CM | POA: Diagnosis not present

## 2018-08-21 DIAGNOSIS — S0990XA Unspecified injury of head, initial encounter: Secondary | ICD-10-CM | POA: Diagnosis present

## 2018-08-21 DIAGNOSIS — R519 Headache, unspecified: Secondary | ICD-10-CM

## 2018-08-21 DIAGNOSIS — I2510 Atherosclerotic heart disease of native coronary artery without angina pectoris: Secondary | ICD-10-CM | POA: Insufficient documentation

## 2018-08-21 MED ORDER — FENTANYL CITRATE (PF) 100 MCG/2ML IJ SOLN
100.0000 ug | Freq: Once | INTRAMUSCULAR | Status: AC
  Administered 2018-08-21: 16:00:00 100 ug via INTRAVENOUS
  Filled 2018-08-21: qty 2

## 2018-08-21 MED ORDER — HYDROCODONE-ACETAMINOPHEN 5-325 MG PO TABS: 2.0000 | ORAL_TABLET | Freq: Four times a day (QID) | ORAL | 0 refills | Status: DC | PRN

## 2018-08-21 NOTE — Discharge Instructions (Addendum)
If you develop continued, recurrent, or worsening headache, fever, neck stiffness, vomiting, blurry or double vision, weakness or numbness in your arms or legs, trouble speaking, or any other new/concerning symptoms then return to the ER for evaluation.   Do not take aspirin as this can cause increased bleeding with your blood thinner. Do not take NSAIDs (I.e. ibuprofen, motrin, aleve, naproxen, etc)

## 2018-08-21 NOTE — ED Provider Notes (Signed)
[facility]-EMERGENCY DEPT Provider Note   CSN: [id number] Arrival date & time: 08/21/18  1527    History   Chief Complaint No chief complaint on file.   HPI Melanie Jarvis is a 68 y.o. female.     HPI  68 year old female presents with severe headache and feeling disoriented after a head injury.  She was in her garden picking up her roses when she stood up and hit the bottom of her deck with the top of her scalp.  She states she briefly lost consciousness.  She is been having a severe headache that is pounding and seems to be getting worse since.  She came here though left prior to being seen.  She states she has vomited once or twice.  She is having some blurry vision and photophobia.  She feels confused and disoriented but states this is slowly improving.  She is on Eliquis for recently diagnosed DVT.  Was told by her oncologist to come here.  She has some chronic left hand and arm weakness and numbness from neck problems but no new weakness or numbness.  She has tried Tylenol and aspirin for pain.  Past Medical History:  Diagnosis Date  . Abscess    chronic abscess chest comes and goes and has test positive for MRSA-steroids triggers it  . Anxiety   . Arthritis   . Asthma    SEVERE  . 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 Complex tear of lateral meniscus of left knee as current injury   . Coronary artery disease   . DDD (degenerative disc disease), cervical   . DDD (degenerative disc disease), lumbar   . Depression   . DVT (deep venous thrombosis) (HCC)    multiple since age 50  . Dysrhythmia   . Fibromyalgia   . GERD (gastroesophageal reflux disease)   . H/O gastric bypass    only has 20%stomack-no nsaids-gi bleed  . H/O hiatal hernia   . Head injury    years ago-memory loss  . Headache(784.0)   . HNP (herniated nucleus pulposus)    multiple  . HOH (hard of hearing)   . Hx MRSA infection   . Hypertension   . Hypothyroidism   .  Lupus (HCC)   . 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 Myocardial infarction (HCC)   . Scoliosis   . Shortness of breath   . Wears glasses     Patient Active Problem List   Diagnosis Date Noted  . Hyperglycemia 06/11/2017  . Chronic pain syndrome 06/11/2017  . Insomnia 06/11/2017  . Hx MRSA infection   . Complex tear of lateral meniscus of left knee as current injury   . Abscess   . Lupus (HCC)   . HNP (herniated nucleus pulposus)   . DDD (degenerative disc disease), lumbar   . Scoliosis   . Head injury   . H/O gastric bypass   . DVT (deep venous thrombosis) (HCC)   . HOH (hard of hearing)   . Wears glasses   . Blood dyscrasia   . Fibromyalgia   . Arthritis   . Headache(784.0)   . H/O hiatal hernia   . Coronary artery disease   . Hypertension   . Dysrhythmia   . Hypothyroidism   . Depression   . GERD (gastroesophageal reflux disease)     Past Surgical History:  Procedure Laterality Date  . ABDOMINAL HYSTERECTOMY  1986  .  APPENDECTOMY  1964  . CARDIAC CATHETERIZATION  2010  . FOOT NEUROMA SURGERY  1971   rt foot  . GASTRIC BYPASS  2010   done at the [facility]  . KNEE ARTHROSCOPY Left 08/12/2012   Procedure: ARTHROSCOPY KNEE, PARTIAL LATERAL MENISCECTOMY, CHONDROPLASTY;  Surgeon: Nilda Simmer, MD;  Location: [facility];  Service: Orthopedics;  Laterality: Left;     OB History   No obstetric history on file.      Home Medications    Prior to Admission medications   Medication Sig Start Date End Date Taking? Authorizing Provider  albuterol (VENTOLIN HFA) 108 (90 Base) MCG/ACT inhaler Inhale 2 puffs into the lungs 2 (two) times a day.    [provider]  cyclobenzaprine (FLEXERIL) 10 MG tablet Take 1 tablet (10 mg total) by mouth 3 (three) times daily as needed for muscle spasms. Patient not taking: Reported on 07/26/2018 08/26/17   Ardith Dark, MD  Eliquis DVT/PE Starter Pack [facility] STARTER PACK) 5 MG TABS  Take as directed on package: start with two-5mg  tablets twice daily for 7 days. On day 8, switch to one-5mg  tablet twice daily. 08/01/18   Arthur Holms, MD  enoxaparin (LOVENOX) 80 MG/0.8ML injection Inject 0.8 mLs (80 mg total) into the skin every 12 (twelve) hours for 12 days. 07/26/18 08/07/18  Derwood Kaplan, MD  lansoprazole (PREVACID) 30 MG capsule Take 1 capsule (30 mg total) by mouth 2 (two) times daily. Takes 2 twice a day 08/08/17   Ardith Dark, MD  lisinopril-hydrochlorothiazide (PRINZIDE,ZESTORETIC) 20-25 MG tablet Take 1 tablet by mouth daily. 08/08/17   Ardith Dark, MD  metoCLOPramide (REGLAN) 10 MG tablet Take 1 tablet (10 mg total) by mouth every 6 (six) hours as needed for nausea. Patient not taking: Reported on 07/26/2018 04/19/18   Doug Sou, MD  ondansetron (ZOFRAN) 8 MG tablet Take 1 tablet (8 mg total) by mouth every 8 (eight) hours as needed for nausea or vomiting. Patient not taking: Reported on 07/26/2018 06/11/17   Ardith Dark, MD  rosuvastatin (CRESTOR) 10 MG tablet Take 1 tablet (10 mg total) by mouth 2 (two) times daily. Patient not taking: Reported on 07/26/2018 06/11/17   Ardith Dark, MD  tamsulosin (FLOMAX) 0.4 MG CAPS capsule Take 2 capsules (0.8 mg total) by mouth at bedtime. Patient not taking: Reported on 08/08/2017 06/11/17   Ardith Dark, MD  tetrahydrozoline-zinc (VISINE-AC) 0.05-0.25 % ophthalmic solution Place 2 drops into both eyes 2 (two) times a day.    [provider]  traZODone (DESYREL) 100 MG tablet Take 2 tablets (200 mg total) by mouth at bedtime. 08/26/17   Ardith Dark, MD    Family History Family History  Problem Relation Age of Onset  . Diabetes Other   . Hypertension Other   . Heart disease Other   . Arthritis Other     Social History Social History   Tobacco Use  . Smoking status: Never Smoker  . Smokeless tobacco: Never Used  Substance Use Topics  . Alcohol use: No  . Drug use: No     Allergies    2-(ethylmercuriothio)benzoic acid; Antazoline; Antihistamines, chlorpheniramine-type; Beta adrenergic blockers; Diltiazem; Erythromycin; Ginseng; Latex; Piperazine; Tetanus toxoid; Tetanus toxoids; Thallous chloride tl 201; Xarelto [rivaroxaban]; and Ace inhibitors   Review of Systems Review of Systems  Constitutional: Negative for fever.  Eyes: Positive for photophobia and visual disturbance.  Gastrointestinal: Positive for vomiting.  Neurological: Positive for headaches. Negative  for weakness and numbness.  All other systems reviewed and are negative.    Physical Exam Updated Vital Signs BP 136/79   Pulse 73   Temp 98.3 F (36.8 C) (Oral)   Resp 18   SpO2 100%   Physical Exam Vitals signs and nursing note reviewed.  Constitutional:      General: She is not in acute distress.    Appearance: She is well-developed. She is not ill-appearing or diaphoretic.  HENT:     Head: Normocephalic and atraumatic.     Right Ear: External ear normal.     Left Ear: External ear normal.     Nose: Nose normal.  Eyes:     General:        Right eye: No discharge.        Left eye: No discharge.     Extraocular Movements: Extraocular movements intact.     Pupils: Pupils are equal, round, and reactive to light.  Cardiovascular:     Rate and Rhythm: Normal rate and regular rhythm.     Heart sounds: Normal heart sounds.  Pulmonary:     Effort: Pulmonary effort is normal.  Abdominal:     General: There is no distension.  Skin:    General: Skin is warm and dry.     Findings: Bruising present.     Comments: Scattered bruises throughout extremities  Neurological:     Mental Status: She is alert and oriented to person, place, and time.     Comments: CN 3-12 grossly intact. 5/5 strength in all 4 extremities. Grossly normal sensation. Normal finger to nose.   Psychiatric:        Mood and Affect: Mood is not anxious.      ED Treatments / Results  Labs (all labs ordered are listed, but  only abnormal results are displayed) Labs Reviewed - No data to display  EKG None  Radiology Ct Head Wo Contrast  Result Date: 08/21/2018 CLINICAL DATA:  Posttraumatic headache after injury 3 days ago. EXAM: CT HEAD WITHOUT CONTRAST TECHNIQUE: Contiguous axial images were obtained from the base of the skull through the vertex without intravenous contrast. COMPARISON:  None. FINDINGS: Brain: No evidence of acute infarction, hemorrhage, hydrocephalus, extra-axial collection or mass lesion/mass effect. Vascular: No hyperdense vessel or unexpected calcification. Skull: Normal. Negative for fracture or focal lesion. Sinuses/Orbits: No acute finding. Other: None. IMPRESSION: Normal head CT. Electronically Signed   By: Lupita RaiderJames  Green Jr M.D.   On: 08/21/2018 17:17    Procedures Procedures (including critical care time)  Medications Ordered in ED Medications  fentaNYL (SUBLIMAZE) injection 100 mcg (100 mcg Intravenous Given 08/21/18 1624)     Initial Impression / Assessment and Plan / ED Course  I have reviewed the triage vital signs and the nursing notes.  Pertinent labs & imaging results that were available during my care of the patient were reviewed by me and considered in my medical decision making (see chart for details).        CT head is benign.  Likely she is suffering from concussion.  At this point, 3 days out, I do not think I would change her anticoagulation status.  She should not take aspirin given she is on Eliquis overall.  Otherwise, she has questions about Eliquis in general because she is having bruising and I have referred her back to her hematologist.  Final Clinical Impressions(s) / ED Diagnoses   Final diagnoses:  Concussion with loss of consciousness of 30 minutes  or less, initial encounter    ED Discharge Orders    None       Pricilla Loveless, MD 08/21/18 1750

## 2018-08-21 NOTE — Progress Notes (Unsigned)
t

## 2018-08-21 NOTE — ED Triage Notes (Addendum)
Monday patient was in yard and raised up and hit top of head on deck. Patient states it knocked her out for a min. Husband caught patient.   Patient came to ED Monday and decided to leave due to registration being "rude"   Patient has been having sever headache since Monday. Patient feels disoriented a little bit.    Patient was seen a couple weeks ago for blood clot in leg. Patient put on blood thinner and sent to see a hematologist  and patient put on a different blood thinner.   Patient had appointment with hematologist tomorrow. Patient spoke to hematologist and was told to go to ED for CT.   A/ox4 Ambulatory in triage.

## 2018-08-21 NOTE — ED Notes (Signed)
Patient tearful with RN reporting she is just scared and feels her brain is scrambled.  Pt requested Secretary to give keys to patient's daughter. Misty Stanley, Secretary took keys to patient's daughter.

## 2018-08-21 NOTE — Telephone Encounter (Signed)
Fax from [facility] regarding pt concerns. Returned call to pt to check status, unable to reach pt on home and mobile#. LMOVm for pt to call office.

## 2018-08-21 NOTE — Telephone Encounter (Signed)
Message returned from patient.  Patient left message stating that she hit her head on Monday, has had periods of confusion and feels as though she has a blood clot in her brain.  Dr. Dion Body notified and order received for pt to have a stat CT of the head.  Order placed by Dr. Dion Body.  Call placed back to patient and patient's voicemail received. Message left to inform patient that Dr. Dion Body has placed an order for a stat CT of the head and to call office back with any questions.

## 2018-08-21 NOTE — Telephone Encounter (Signed)
Call placed to patient to confirm that she has received message left on her voicemail from scheduling regarding stat CT scan today.  Pt states that her daughter is at her house and she is leaving now to go for CT scan, but does not know where to go.  Pt instructed to come to [facility] for scan.  Patient then informed RN that she is "bleeding all over" and would like to know if see needs to be seen by Dr. Dion Body.  When asked what patient meant by "bleeding all over", pt states that she has large bruises on her feet, arms and torso.  Dr. Dion Body notified and order received for patient to go to the ER now.  Pt hyper vocal about not wanting to go to the ED d/t restrictions regarding corona-virus and the no visitor policy.  Pt again instructed to go to the ER now per order of Dr. Dion Body.  Pt now agreeable and states that she will have her daughter take her to the ER now.  Dr. Dion Body notified.

## 2018-08-22 ENCOUNTER — Other Ambulatory Visit: Payer: Self-pay

## 2018-08-22 ENCOUNTER — Telehealth: Payer: Self-pay | Admitting: *Deleted

## 2018-08-22 ENCOUNTER — Inpatient Hospital Stay: Payer: Medicare Other | Attending: Hematology | Admitting: Hematology

## 2018-08-22 NOTE — Telephone Encounter (Signed)
Attempt to reach pt multiple times for webex appt. Unable to reach. LMOVM for pt to call office to r/s

## 2018-08-25 NOTE — Telephone Encounter (Signed)
Attempt to reach pt, no answer. LMOVM for pt to call office to r/s appt.

## 2018-09-04 ENCOUNTER — Telehealth: Payer: Self-pay | Admitting: *Deleted

## 2018-09-04 NOTE — Telephone Encounter (Signed)
Called pt regarding r/s last office visit that was missed. Unable to reach pt or leave message as there is no vm. Called emergency contact Damaris Schooner, unable to reach, lmovm.

## 2019-01-08 ENCOUNTER — Other Ambulatory Visit: Payer: Self-pay

## 2019-01-08 ENCOUNTER — Emergency Department (HOSPITAL_COMMUNITY)
Admission: EM | Admit: 2019-01-08 | Discharge: 2019-01-08 | Disposition: A | Discharge status: Home / Self Care | Payer: Medicare Other | Attending: Emergency Medicine | Admitting: Emergency Medicine

## 2019-01-08 ENCOUNTER — Encounter (HOSPITAL_COMMUNITY): Payer: Self-pay | Admitting: Emergency Medicine

## 2019-01-08 DIAGNOSIS — Z79899 Other long term (current) drug therapy: Secondary | ICD-10-CM | POA: Diagnosis not present

## 2019-01-08 DIAGNOSIS — I10 Essential (primary) hypertension: Secondary | ICD-10-CM | POA: Diagnosis not present

## 2019-01-08 DIAGNOSIS — E039 Hypothyroidism, unspecified: Secondary | ICD-10-CM | POA: Insufficient documentation

## 2019-01-08 DIAGNOSIS — J45909 Unspecified asthma, uncomplicated: Secondary | ICD-10-CM | POA: Insufficient documentation

## 2019-01-08 DIAGNOSIS — R1031 Right lower quadrant pain: Secondary | ICD-10-CM | POA: Diagnosis present

## 2019-01-08 DIAGNOSIS — N390 Urinary tract infection, site not specified: Secondary | ICD-10-CM | POA: Insufficient documentation

## 2019-01-08 DIAGNOSIS — I252 Old myocardial infarction: Secondary | ICD-10-CM | POA: Insufficient documentation

## 2019-01-08 DIAGNOSIS — I2510 Atherosclerotic heart disease of native coronary artery without angina pectoris: Secondary | ICD-10-CM | POA: Diagnosis not present

## 2019-01-08 LAB — URINALYSIS, ROUTINE W REFLEX MICROSCOPIC
Bacteria, UA: NONE SEEN
Bilirubin Urine: NEGATIVE
Glucose, UA: NEGATIVE mg/dL
Ketones, ur: NEGATIVE mg/dL
Nitrite: NEGATIVE
Protein, ur: 30 mg/dL — AB
Specific Gravity, Urine: 1.023 (ref 1.005–1.030)
WBC, UA: >50 WBC/hpf — ABNORMAL HIGH (ref 0–5)
pH: 5 (ref 5.0–8.0)

## 2019-01-08 MED ORDER — PHENAZOPYRIDINE HCL 200 MG PO TABS
200.0000 mg | ORAL_TABLET | Freq: Three times a day (TID) | ORAL | Status: DC
  Administered 2019-01-08: 200 mg via ORAL
  Filled 2019-01-08: qty 1

## 2019-01-08 MED ORDER — CEPHALEXIN 500 MG PO CAPS
500.0000 mg | ORAL_CAPSULE | Freq: Once | ORAL | Status: AC
  Administered 2019-01-08: 21:00:00 500 mg via ORAL
  Filled 2019-01-08: qty 1

## 2019-01-08 MED ORDER — CEPHALEXIN 500 MG PO CAPS: 500.0000 mg | ORAL_CAPSULE | Freq: Four times a day (QID) | ORAL | 0 refills | Status: DC

## 2019-01-08 MED ORDER — ACETAMINOPHEN 325 MG PO TABS
650.0000 mg | ORAL_TABLET | Freq: Once | ORAL | Status: AC
  Administered 2019-01-08: 21:00:00 650 mg via ORAL
  Filled 2019-01-08: qty 2

## 2019-01-08 NOTE — ED Notes (Addendum)
Into see pt and give medication pt not found in room gown and blankets found on bed. Call to phone number listed in chart no one answered and mailbox is full.

## 2019-01-08 NOTE — ED Triage Notes (Signed)
Pt reports back pains with dysuria x 4-5 days.

## 2019-01-08 NOTE — ED Notes (Signed)
UPDATED ON BED STATUS. PLEASANT AND COOPERATIVE WITH CARE

## 2019-01-08 NOTE — ED Provider Notes (Signed)
[facility] DEPT Provider Note   CSN: [id number] Arrival date & time: 01/08/19  1437     History   Chief Complaint Chief Complaint  Patient presents with  . Flank Pain  . Dysuria    HPI Melanie Jarvis is a 68 y.o. female.     The history is provided by the patient.  Dysuria Pain quality:  Aching, sharp, shooting and burning Pain severity:  Moderate Onset quality:  Gradual Duration:  4 days Timing:  Constant Progression:  Worsening Chronicity:  New Recent urinary tract infections: no   Relieved by:  None tried Worsened by:  Nothing Ineffective treatments:  Cranberry juice Urinary symptoms: foul-smelling urine, frequent urination, hesitancy and incontinence   Associated symptoms: abdominal pain and flank pain   Associated symptoms: no fever, no nausea and no vomiting   Risk factors: hx of pyelonephritis and recurrent urinary tract infections   Risk factors: no hx of urolithiasis and no urinary catheter     Past Medical History:  Diagnosis Date  . Abscess    chronic abscess chest comes and goes and has test positive for MRSA-steroids triggers it  . Anxiety   . Arthritis   . Asthma    SEVERE  . 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 Complex tear of lateral meniscus of left knee as current injury   . Coronary artery disease   . DDD (degenerative disc disease), cervical   . DDD (degenerative disc disease), lumbar   . Depression   . DVT (deep venous thrombosis) ([place])    multiple since age 54  . Dysrhythmia   . Fibromyalgia   . GERD (gastroesophageal reflux disease)   . H/O gastric bypass    only has 20%stomack-no nsaids-gi bleed  . H/O hiatal hernia   . Head injury    years ago-memory loss  . Headache(784.0)   . HNP (herniated nucleus pulposus)    multiple  . HOH (hard of hearing)   . Hx MRSA infection   . Hypertension   . Hypothyroidism   . Lupus ([place])   . MI (myocardial infarction) ([place])    due to clots   . Myocardial infarction ([place])   . Scoliosis   . Shortness of breath   . Wears glasses     Patient Active Problem List   Diagnosis Date Noted  . Hyperglycemia 06/11/2017  . Chronic pain syndrome 06/11/2017  . Insomnia 06/11/2017  . Hx MRSA infection   . Complex tear of lateral meniscus of left knee as current injury   . Abscess   . Lupus ([place])   . HNP (herniated nucleus pulposus)   . DDD (degenerative disc disease), lumbar   . Scoliosis   . Head injury   . H/O gastric bypass   . DVT (deep venous thrombosis) ([place])   . HOH (hard of hearing)   . Wears glasses   . Blood dyscrasia   . Fibromyalgia   . Arthritis   . Headache(784.0)   . H/O hiatal hernia   . Coronary artery disease   . Hypertension   . Dysrhythmia   . Hypothyroidism   . Depression   . GERD (gastroesophageal reflux disease)     Past Surgical History:  Procedure Laterality Date  . ABDOMINAL HYSTERECTOMY  1986  . APPENDECTOMY  1964  . CARDIAC CATHETERIZATION  2010  . [place]   rt foot  . GASTRIC BYPASS  2010   done at  [facility]  . KNEE ARTHROSCOPY Left 08/12/2012   Procedure: ARTHROSCOPY KNEE, PARTIAL LATERAL MENISCECTOMY, CHONDROPLASTY;  Surgeon: Nilda Simmer, MD;  Location: [facility];  Service: Orthopedics;  Laterality: Left;     OB History   No obstetric history on file.      Home Medications    Prior to Admission medications   Medication Sig Start Date End Date Taking? Authorizing Provider  albuterol (VENTOLIN HFA) 108 (90 Base) MCG/ACT inhaler Inhale 2 puffs into the lungs 2 (two) times a day.    [provider]  cephALEXin (KEFLEX) 500 MG capsule Take 1 capsule (500 mg total) by mouth 4 (four) times daily. 01/08/19   Gwyneth Sprout, MD  cyclobenzaprine (FLEXERIL) 10 MG tablet Take 1 tablet (10 mg total) by mouth 3 (three) times daily as needed for muscle spasms. Patient not taking: Reported on 07/26/2018 08/26/17   Ardith Dark, MD   Eliquis DVT/PE Starter Pack [facility] STARTER PACK) 5 MG TABS Take as directed on package: start with two-5mg  tablets twice daily for 7 days. On day 8, switch to one-5mg  tablet twice daily. 08/01/18   Arthur Holms, MD  enoxaparin (LOVENOX) 80 MG/0.8ML injection Inject 0.8 mLs (80 mg total) into the skin every 12 (twelve) hours for 12 days. 07/26/18 08/07/18  Derwood Kaplan, MD  HYDROcodone-acetaminophen (NORCO) 5-325 MG tablet Take 2 tablets by mouth every 6 (six) hours as needed. 08/21/18   Pricilla Loveless, MD  lansoprazole (PREVACID) 30 MG capsule Take 1 capsule (30 mg total) by mouth 2 (two) times daily. Takes 2 twice a day 08/08/17   Ardith Dark, MD  lisinopril-hydrochlorothiazide (PRINZIDE,ZESTORETIC) 20-25 MG tablet Take 1 tablet by mouth daily. 08/08/17   Ardith Dark, MD  metoCLOPramide (REGLAN) 10 MG tablet Take 1 tablet (10 mg total) by mouth every 6 (six) hours as needed for nausea. Patient not taking: Reported on 07/26/2018 04/19/18   Doug Sou, MD  ondansetron (ZOFRAN) 8 MG tablet Take 1 tablet (8 mg total) by mouth every 8 (eight) hours as needed for nausea or vomiting. Patient not taking: Reported on 07/26/2018 06/11/17   Ardith Dark, MD  rosuvastatin (CRESTOR) 10 MG tablet Take 1 tablet (10 mg total) by mouth 2 (two) times daily. Patient not taking: Reported on 07/26/2018 06/11/17   Ardith Dark, MD  tamsulosin (FLOMAX) 0.4 MG CAPS capsule Take 2 capsules (0.8 mg total) by mouth at bedtime. Patient not taking: Reported on 08/08/2017 06/11/17   Ardith Dark, MD  tetrahydrozoline-zinc (VISINE-AC) 0.05-0.25 % ophthalmic solution Place 2 drops into both eyes 2 (two) times a day.    [provider]  traZODone (DESYREL) 100 MG tablet Take 2 tablets (200 mg total) by mouth at bedtime. 08/26/17   Ardith Dark, MD    Family History Family History  Problem Relation Age of Onset  . Diabetes Other   . Hypertension Other   . Heart disease Other   . Arthritis Other     Social  History Social History   Tobacco Use  . Smoking status: Never Smoker  . Smokeless tobacco: Never Used  Substance Use Topics  . Alcohol use: No  . Drug use: No     Allergies   2-(ethylmercuriothio)benzoic acid; Antazoline; Antihistamines, chlorpheniramine-type; Beta adrenergic blockers; Diltiazem; Erythromycin; Ginseng; Latex; Piperazine; Tetanus toxoid; Tetanus toxoids; Thallous chloride tl 201; Xarelto [rivaroxaban]; and Ace inhibitors   Review of Systems Review of Systems  Constitutional: Negative for fever.  Gastrointestinal:  Positive for abdominal pain. Negative for nausea and vomiting.  Genitourinary: Positive for dysuria and flank pain.  All other systems reviewed and are negative.    Physical Exam Updated Vital Signs BP 123/60 (BP Location: Left Arm)   Pulse 95   Temp 97.8 F (36.6 C) (Oral)   Resp 16   SpO2 99%   Physical Exam Vitals signs and nursing note reviewed.  Constitutional:      General: She is not in acute distress.    Appearance: She is well-developed and normal weight.  HENT:     Head: Normocephalic and atraumatic.  Eyes:     Conjunctiva/sclera: Conjunctivae normal.     Pupils: Pupils are equal, round, and reactive to light.  Neck:     Musculoskeletal: Normal range of motion and neck supple.  Cardiovascular:     Rate and Rhythm: Normal rate and regular rhythm.     Heart sounds: No murmur.  Pulmonary:     Effort: Pulmonary effort is normal. No respiratory distress.     Breath sounds: Normal breath sounds. No wheezing or rales.  Abdominal:     General: There is no distension.     Palpations: Abdomen is soft.     Tenderness: There is no abdominal tenderness. There is right CVA tenderness. There is no guarding or rebound.  Musculoskeletal: Normal range of motion.        General: No tenderness.  Skin:    General: Skin is warm and dry.     Findings: No erythema or rash.  Neurological:     General: No focal deficit present.     Mental  Status: She is alert and oriented to person, place, and time. Mental status is at baseline.  Psychiatric:        Mood and Affect: Mood normal.        Behavior: Behavior normal.        Thought Content: Thought content normal.      ED Treatments / Results  Labs (all labs ordered are listed, but only abnormal results are displayed) Labs Reviewed  URINALYSIS, ROUTINE W REFLEX MICROSCOPIC - Abnormal; Notable for the following components:      Result Value   APPearance CLOUDY (*)    Hgb urine dipstick SMALL (*)    Protein, ur 30 (*)    Leukocytes,Ua LARGE (*)    WBC, UA >50 (*)    All other components within normal limits  URINE CULTURE    EKG None  Radiology No results found.  Procedures Procedures (including critical care time)  Medications Ordered in ED Medications  cephALEXin (KEFLEX) capsule 500 mg (has no administration in time range)  phenazopyridine (PYRIDIUM) tablet 200 mg (has no administration in time range)  acetaminophen (TYLENOL) tablet 650 mg (has no administration in time range)     Initial Impression / Assessment and Plan / ED Course  I have reviewed the triage vital signs and the nursing notes.  Pertinent labs & imaging results that were available during my care of the patient were reviewed by me and considered in my medical decision making (see chart for details).        Patient is a 68 year old female presenting with urinary tract infection with dysuria, frequency and starting yesterday started to have some right flank pain and a headache.  Patient has no fever, vomiting or other symptoms suggestive of pyelonephritis at this time.  Feel if anything it is early [place].  Patient's vital signs are within normal limits  and she is well-appearing.  She states years ago she did have to be hospitalized for IV antibiotics for pyelonephritis but nothing recently.  Patient last had UTI in January and was treated with Keflex.  Unfortunately microscopy grew back  insufficient growth.  Patient's UA today suggestive of a UTI.  Patient treated with Keflex and Pyridium.  Culture sent. Final Clinical Impressions(s) / ED Diagnoses   Final diagnoses:  Lower urinary tract infectious disease    ED Discharge Orders         Ordered    cephALEXin (KEFLEX) 500 MG capsule  4 times daily     01/08/19 2017           Gwyneth SproutPlunkett, Dawnn Nam, MD 01/08/19 2021

## 2019-01-08 NOTE — ED Notes (Signed)
Called lab to add on urine culture ?

## 2019-01-10 LAB — URINE CULTURE
Culture: >=100000 — AB
Special Requests: NORMAL

## 2019-01-11 ENCOUNTER — Telehealth: Payer: Self-pay | Admitting: Emergency Medicine

## 2019-01-11 NOTE — Telephone Encounter (Signed)
Post ED Visit - Positive Culture Follow-up  Culture report reviewed by antimicrobial stewardship pharmacist: [facility] Team []  Elenor Quinones, Pharm.D. []  Heide Guile, Pharm.D., BCPS AQ-ID []  Parks Neptune, Pharm.D., BCPS []  Alycia Rossetti, Pharm.D., BCPS []  [place], Pharm.D., BCPS, AAHIVP []  Legrand Como, Pharm.D., BCPS, AAHIVP []  Salome Arnt, PharmD, BCPS []  Johnnette Gourd, PharmD, BCPS [x]  Hughes Better, PharmD, BCPS []  Leeroy Cha, PharmD []  Laqueta Linden, PharmD, BCPS []  Albertina Parr, PharmD  [facility] []  Leodis Sias, PharmD []  Lindell Spar, PharmD []  Royetta Asal, PharmD []  Graylin Shiver, Rph []  Rema Fendt) Glennon Mac, PharmD []  Arlyn Dunning, PharmD []  Netta Cedars, PharmD []  Dia Sitter, PharmD []  Leone Haven, PharmD []  Gretta Arab, PharmD []  Theodis Shove, PharmD []  Peggyann Juba, PharmD []  Reuel Boom, PharmD   Positive urine culture Treated with Cephalexin, organism sensitive to the same and no further patient follow-up is required at this time.  Melanie Jarvis Melanie Jarvis 01/11/2019, 2:59 PM

## 2019-06-17 ENCOUNTER — Other Ambulatory Visit: Payer: Self-pay

## 2019-06-17 ENCOUNTER — Emergency Department (HOSPITAL_COMMUNITY): Payer: Medicare Other

## 2019-06-17 ENCOUNTER — Encounter (HOSPITAL_COMMUNITY): Payer: Self-pay | Admitting: Emergency Medicine

## 2019-06-17 ENCOUNTER — Emergency Department (HOSPITAL_COMMUNITY)
Admission: EM | Admit: 2019-06-17 | Discharge: 2019-06-18 | Disposition: A | Discharge status: Home / Self Care | Payer: Medicare Other | Attending: Emergency Medicine | Admitting: Emergency Medicine

## 2019-06-17 DIAGNOSIS — E039 Hypothyroidism, unspecified: Secondary | ICD-10-CM | POA: Insufficient documentation

## 2019-06-17 DIAGNOSIS — Z20822 Contact with and (suspected) exposure to covid-19: Secondary | ICD-10-CM | POA: Insufficient documentation

## 2019-06-17 DIAGNOSIS — Z91040 Latex allergy status: Secondary | ICD-10-CM | POA: Insufficient documentation

## 2019-06-17 DIAGNOSIS — M791 Myalgia, unspecified site: Secondary | ICD-10-CM | POA: Insufficient documentation

## 2019-06-17 DIAGNOSIS — Z7901 Long term (current) use of anticoagulants: Secondary | ICD-10-CM | POA: Insufficient documentation

## 2019-06-17 DIAGNOSIS — I10 Essential (primary) hypertension: Secondary | ICD-10-CM | POA: Diagnosis not present

## 2019-06-17 DIAGNOSIS — Z79899 Other long term (current) drug therapy: Secondary | ICD-10-CM | POA: Insufficient documentation

## 2019-06-17 DIAGNOSIS — J45909 Unspecified asthma, uncomplicated: Secondary | ICD-10-CM | POA: Diagnosis not present

## 2019-06-17 NOTE — ED Provider Notes (Signed)
[facility]-EMERGENCY DEPT Provider Note   CSN: [id number] Arrival date & time: 06/17/19  2157     History Chief Complaint  Patient presents with  . Generalized Body Aches    Melanie [facility]vis is a 69 y.o. female history of asthma, CAD, GERD, DVT, hypertension, hypothyroidism.  Patient presents today for concern of COVID-19 viral infection.  She reports that she has had multiple family members tested positive for COVID-19 over the past 10 days.  She reports that 2 days ago she developed mild nonproductive cough, generalized body aches and fatigue.  She reports that symptoms have been constant since onset.  She describes body aches generalized aching sensation mild constant nonradiating no clear aggravating or alleviating factors.  She additionally reports tactile fevers at home over the past 2 days.  She denies headache, vision changes, neck pain, sore throat, chest pain/shortness of breath, hemoptysis, abdominal pain, nausea/vomiting, diarrhea, dysuria/hematuria, extremity pain/swelling, color change, numbness/tingling, weakness or any additional concerns.  HPI     Past Medical History:  Diagnosis Date  . Abscess    chronic abscess chest comes and goes and has test positive for MRSA-steroids triggers it  . Anxiety   . Arthritis   . Asthma    SEVERE  . 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 Complex tear of lateral meniscus of left knee as current injury   . Coronary artery disease   . DDD (degenerative disc disease), cervical   . DDD (degenerative disc disease), lumbar   . Depression   . DVT (deep venous thrombosis) (HCC)    multiple since age 80  . Dysrhythmia   . Fibromyalgia   . GERD (gastroesophageal reflux disease)   . H/O gastric bypass    only has 20%stomack-no nsaids-gi bleed  . H/O hiatal hernia   . Head injury    years ago-memory loss  . Headache(784.0)   . HNP (herniated nucleus pulposus)    multiple  . HOH (hard of hearing)    . Hx MRSA infection   . Hypertension   . Hypothyroidism   . Lupus (HCC)   . 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 Myocardial infarction (HCC)   . Scoliosis   . Shortness of breath   . Wears glasses     Patient Active Problem List   Diagnosis Date Noted  . Hyperglycemia 06/11/2017  . Chronic pain syndrome 06/11/2017  . Insomnia 06/11/2017  . Hx MRSA infection   . Complex tear of lateral meniscus of left knee as current injury   . Abscess   . Lupus (HCC)   . HNP (herniated nucleus pulposus)   . DDD (degenerative disc disease), lumbar   . Scoliosis   . Head injury   . H/O gastric bypass   . DVT (deep venous thrombosis) (HCC)   . HOH (hard of hearing)   . Wears glasses   . Blood dyscrasia   . Fibromyalgia   . Arthritis   . Headache(784.0)   . H/O hiatal hernia   . Coronary artery disease   . Hypertension   . Dysrhythmia   . Hypothyroidism   . Depression   . GERD (gastroesophageal reflux disease)     Past Surgical History:  Procedure Laterality Date  . ABDOMINAL HYSTERECTOMY  1986  . APPENDECTOMY  1964  . CARDIAC CATHETERIZATION  2010  . FOOT NEUROMA SURGERY  1971   rt foot  . GASTRIC BYPASS  2010  done at the [facility]  . KNEE ARTHROSCOPY Left 08/12/2012   Procedure: ARTHROSCOPY KNEE, PARTIAL LATERAL MENISCECTOMY, CHONDROPLASTY;  Surgeon: Nilda Simmer, MD;  Location: [facility];  Service: Orthopedics;  Laterality: Left;     OB History   No obstetric history on file.     Family History  Problem Relation Age of Onset  . Diabetes Other   . Hypertension Other   . Heart disease Other   . Arthritis Other     Social History   Tobacco Use  . Smoking status: Never Smoker  . Smokeless tobacco: Never Used  Substance Use Topics  . Alcohol use: No  . Drug use: No    Home Medications Prior to Admission medications   Medication Sig Start Date End Date Taking? Authorizing Provider  albuterol (VENTOLIN HFA) 108 (90  Base) MCG/ACT inhaler Inhale 2 puffs into the lungs 2 (two) times a day.    [provider]  cephALEXin (KEFLEX) 500 MG capsule Take 1 capsule (500 mg total) by mouth 4 (four) times daily. 01/08/19   Gwyneth Sprout, MD  cyclobenzaprine (FLEXERIL) 10 MG tablet Take 1 tablet (10 mg total) by mouth 3 (three) times daily as needed for muscle spasms. Patient not taking: Reported on 07/26/2018 08/26/17   Ardith Dark, MD  Eliquis DVT/PE Starter Pack [facility] STARTER PACK) 5 MG TABS Take as directed on package: start with two-5mg  tablets twice daily for 7 days. On day 8, switch to one-5mg  tablet twice daily. 08/01/18   Arthur Holms, MD  enoxaparin (LOVENOX) 80 MG/0.8ML injection Inject 0.8 mLs (80 mg total) into the skin every 12 (twelve) hours for 12 days. 07/26/18 08/07/18  Derwood Kaplan, MD  HYDROcodone-acetaminophen (NORCO) 5-325 MG tablet Take 2 tablets by mouth every 6 (six) hours as needed. 08/21/18   Pricilla Loveless, MD  lansoprazole (PREVACID) 30 MG capsule Take 1 capsule (30 mg total) by mouth 2 (two) times daily. Takes 2 twice a day 08/08/17   Ardith Dark, MD  lisinopril-hydrochlorothiazide (PRINZIDE,ZESTORETIC) 20-25 MG tablet Take 1 tablet by mouth daily. 08/08/17   Ardith Dark, MD  metoCLOPramide (REGLAN) 10 MG tablet Take 1 tablet (10 mg total) by mouth every 6 (six) hours as needed for nausea. Patient not taking: Reported on 07/26/2018 04/19/18   Doug Sou, MD  ondansetron (ZOFRAN) 8 MG tablet Take 1 tablet (8 mg total) by mouth every 8 (eight) hours as needed for nausea or vomiting. Patient not taking: Reported on 07/26/2018 06/11/17   Ardith Dark, MD  rosuvastatin (CRESTOR) 10 MG tablet Take 1 tablet (10 mg total) by mouth 2 (two) times daily. Patient not taking: Reported on 07/26/2018 06/11/17   Ardith Dark, MD  tamsulosin (FLOMAX) 0.4 MG CAPS capsule Take 2 capsules (0.8 mg total) by mouth at bedtime. Patient not taking: Reported on 08/08/2017 06/11/17   Ardith Dark, MD    tetrahydrozoline-zinc (VISINE-AC) 0.05-0.25 % ophthalmic solution Place 2 drops into both eyes 2 (two) times a day.    [provider]  traZODone (DESYREL) 100 MG tablet Take 2 tablets (200 mg total) by mouth at bedtime. 08/26/17   Ardith Dark, MD    Allergies    2-(ethylmercuriothio)benzoic acid; Antazoline; Antihistamines, chlorpheniramine-type; Beta adrenergic blockers; Diltiazem; Erythromycin; Ginseng; Latex; Piperazine; Tetanus toxoid; Tetanus toxoids; Thallous chloride tl 201; Xarelto [rivaroxaban]; and Ace inhibitors  Review of Systems   Review of Systems Ten systems are reviewed and are negative for acute change except  as noted in the HPI Physical Exam Updated Vital Signs BP (!) 114/47   Pulse (!) 55   Temp 98.4 F (36.9 C) (Oral)   Resp 20   Ht 5\' 5"  (1.651 m)   Wt 63.5 kg   SpO2 93%   BMI 23.30 kg/m   Physical Exam Constitutional:      General: She is not in acute distress.    Appearance: Normal appearance. She is well-developed. She is not ill-appearing or diaphoretic.  HENT:     Head: Normocephalic and atraumatic.     Right Ear: External ear normal.     Left Ear: External ear normal.     Nose: Nose normal.  Eyes:     General: Vision grossly intact. Gaze aligned appropriately.     Pupils: Pupils are equal, round, and reactive to light.  Neck:     Trachea: Trachea and phonation normal. No tracheal deviation.  Cardiovascular:     Rate and Rhythm: Normal rate and regular rhythm.     Pulses: Normal pulses.          Dorsalis pedis pulses are 2+ on the right side and 2+ on the left side.     Heart sounds: Normal heart sounds.  Pulmonary:     Effort: Pulmonary effort is normal. No respiratory distress.     Breath sounds: Normal breath sounds.  Abdominal:     General: There is no distension.     Palpations: Abdomen is soft.     Tenderness: There is no abdominal tenderness. There is no guarding or rebound.  Musculoskeletal:        General: No  tenderness. Normal range of motion.     Cervical back: Normal range of motion.     Right lower leg: No edema.     Left lower leg: No edema.  Skin:    General: Skin is warm and dry.  Neurological:     Mental Status: She is alert.     GCS: GCS eye subscore is 4. GCS verbal subscore is 5. GCS motor subscore is 6.     Comments: Speech is clear and goal oriented, follows commands Major Cranial nerves without deficit, no facial droop Moves extremities without ataxia, coordination intact  Psychiatric:        Behavior: Behavior normal.     ED Results / Procedures / Treatments   Labs (all labs ordered are listed, but only abnormal results are displayed) Labs Reviewed  CBC WITH DIFFERENTIAL/PLATELET  COMPREHENSIVE METABOLIC PANEL  POC SARS CORONAVIRUS 2 AG -  ED    EKG None  Radiology DG Chest Portable 1 View  Result Date: 06/17/2019 CLINICAL DATA:  Cough EXAM: PORTABLE CHEST 1 VIEW COMPARISON:  08/08/2017 FINDINGS: Low lung volumes. Minimal atelectasis right base. No consolidation or effusion. Normal heart size. No pneumothorax. Aortic atherosclerosis IMPRESSION: Hypoventilatory changes with minimal atelectasis right base Electronically Signed   By: Donavan Foil M.D.   On: 06/17/2019 23:27    Procedures Procedures (including critical care time)  Medications Ordered in ED Medications - No data to display  ED Course  I have reviewed the triage vital signs and the nursing notes.  Pertinent labs & imaging results that were available during my care of the patient were reviewed by me and considered in my medical decision making (see chart for details).    MDM Rules/Calculators/A&P                     69 year old female  with history as detailed above presents today with 2-day history of viral-like symptoms.  She reports tactile fevers, nonproductive cough, generalized body aches and fatigue for the past 2 days.  Multiple family members tested positive for COVID-19 over the past 10  days.  High suspicion the patient may have COVID-19 viral infection.  On exam she is well-appearing, pleasant, no acute distress.  Cranial nerves intact, airway clear, no meningeal signs, heart regular rate and rhythm, lungs clear, abdomen soft nontender without peritoneal signs, neurovascular intact in all 4 extremities without evidence of DVT.  She denies any chest pain or shortness of breath.  Vital signs stable throughout my evaluation on room air.  No hypoxia or tachycardia.  CBC, CMP, chest x-ray and Covid test have been ordered. - Care handoff given to Ivar Drape, PA-C at shift change, plan of care is to follow-up on labs and imaging.  Pending no acute lab/imaging abnormalities and ambulation without hypoxia anticipate discharge.  Case was discussed with Dr. Rodena Medin during this visit.  Note: Portions of this report may have been transcribed using voice recognition software. Every effort was made to ensure accuracy; however, inadvertent computerized transcription errors may still be present. Final Clinical Impression(s) / ED Diagnoses Final diagnoses:  None    Rx / DC Orders ED Discharge Orders    None       Elizabeth Palau 06/18/19 0024    Wynetta Fines, MD 06/18/19 2149

## 2019-06-17 NOTE — ED Triage Notes (Signed)
Patient c/o body aches and fevers x3 days. Reports family is COVID+. Denies SOB, chest pain, N/V/D. Patient ambulatory from triage to treatment room without difficulty.

## 2019-06-18 DIAGNOSIS — M791 Myalgia, unspecified site: Secondary | ICD-10-CM | POA: Diagnosis not present

## 2019-06-18 LAB — CBC WITH DIFFERENTIAL/PLATELET
Abs Immature Granulocytes: 0.03 10*3/uL (ref 0.00–0.07)
Basophils Absolute: 0 10*3/uL (ref 0.0–0.1)
Basophils Relative: 0 %
Eosinophils Absolute: 0 10*3/uL (ref 0.0–0.5)
Eosinophils Relative: 0 %
HCT: 42.5 % (ref 36.0–46.0)
Hemoglobin: 13.9 g/dL (ref 12.0–15.0)
Immature Granulocytes: 0 %
Lymphocytes Relative: 14 %
Lymphs Abs: 1.2 10*3/uL (ref 0.7–4.0)
MCH: 28.3 pg (ref 26.0–34.0)
MCHC: 32.7 g/dL (ref 30.0–36.0)
MCV: 86.4 fL (ref 80.0–100.0)
Monocytes Absolute: 0.8 10*3/uL (ref 0.1–1.0)
Monocytes Relative: 9 %
Neutro Abs: 6.5 10*3/uL (ref 1.7–7.7)
Neutrophils Relative %: 77 %
Platelets: 165 10*3/uL (ref 150–400)
RBC: 4.92 MIL/uL (ref 3.87–5.11)
RDW: 12.4 % (ref 11.5–15.5)
WBC: 8.6 10*3/uL (ref 4.0–10.5)
nRBC: 0 % (ref 0.0–0.2)

## 2019-06-18 LAB — COMPREHENSIVE METABOLIC PANEL
ALT: 10 U/L (ref 0–44)
AST: 13 U/L — ABNORMAL LOW (ref 15–41)
Albumin: 3.3 g/dL — ABNORMAL LOW (ref 3.5–5.0)
Alkaline Phosphatase: 52 U/L (ref 38–126)
Anion gap: 9 (ref 5–15)
BUN: 12 mg/dL (ref 8–23)
CO2: 26 mmol/L (ref 22–32)
Calcium: 8.5 mg/dL — ABNORMAL LOW (ref 8.9–10.3)
Chloride: 104 mmol/L (ref 98–111)
Creatinine, Ser: 1.02 mg/dL — ABNORMAL HIGH (ref 0.44–1.00)
GFR calc Af Amer: >60 mL/min (ref >60)
GFR calc non Af Amer: 56 mL/min — ABNORMAL LOW (ref >60)
Glucose, Bld: 97 mg/dL (ref 70–99)
Potassium: 3.6 mmol/L (ref 3.5–5.1)
Sodium: 139 mmol/L (ref 135–145)
Total Bilirubin: 1.2 mg/dL (ref 0.3–1.2)
Total Protein: 6.2 g/dL — ABNORMAL LOW (ref 6.5–8.1)

## 2019-06-18 LAB — POC SARS CORONAVIRUS 2 AG -  ED: SARS Coronavirus 2 Ag: NEGATIVE

## 2019-06-18 NOTE — Discharge Instructions (Addendum)
You should isolate until your outstanding COVID test results.  You chest x-ray and labs reveal no evidence of emergent condition.  Please follow-up with your doctor.   Please return to the ER if your symptoms worsen.

## 2019-06-18 NOTE — ED Notes (Signed)
Notified EDP,Palumbo,MD., pt. POC Covid test negative.

## 2019-06-18 NOTE — ED Provider Notes (Signed)
POC covid negative.  Labs and CXR reassuring.  Patient ambulates maintaining 97% sat on RA.   Roxy Horseman, PA-C 06/18/19 Orlie Pollen, April, MD 06/18/19 [id number]

## 2019-06-18 NOTE — Progress Notes (Signed)
Patient's O2 saturations stayed above 97% on RA during ambulation.

## 2019-09-08 IMAGING — CT CT HEAD WITHOUT CONTRAST
3 series · 16 of 47 positions shown, 19 images · non-contrast
Comparison: None.

CLINICAL DATA: Posttraumatic headache after injury 3 days ago.

EXAM:
CT HEAD WITHOUT CONTRAST
TECHNIQUE: Contiguous axial images were obtained from the base of the skull
through the vertex without intravenous contrast.

[Series 2: head wo · axial · 0.40mm/px · z∈[+1456,+1581]mm · 10 of 30 slices shown, 13 images]
[im 3/30  brain]
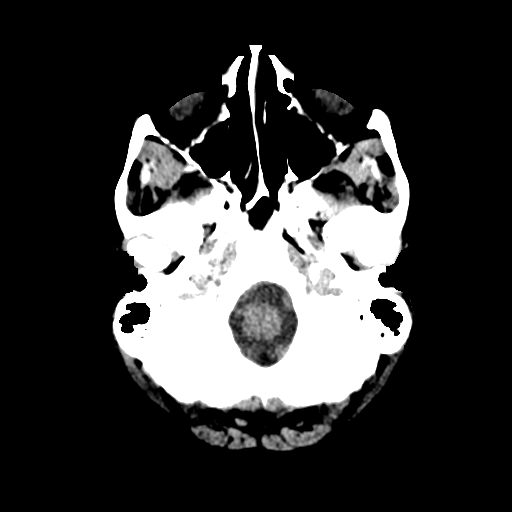
[im 3/30  bone]
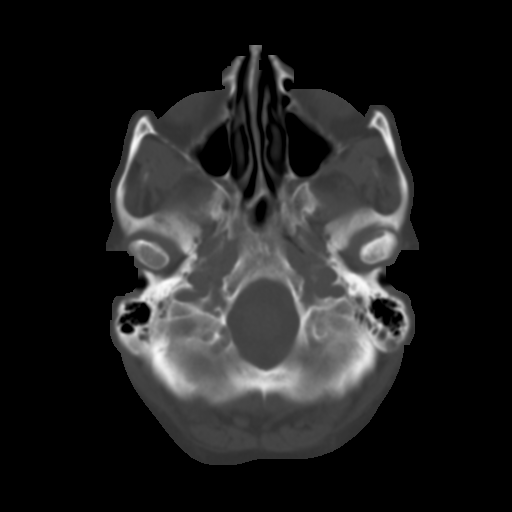
[im 6/30  brain]
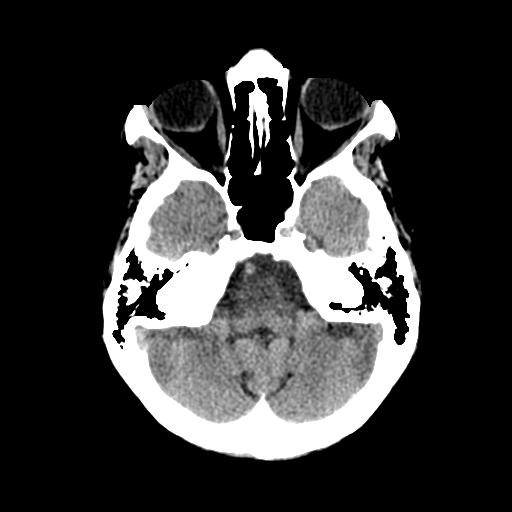
[im 9/30  brain]
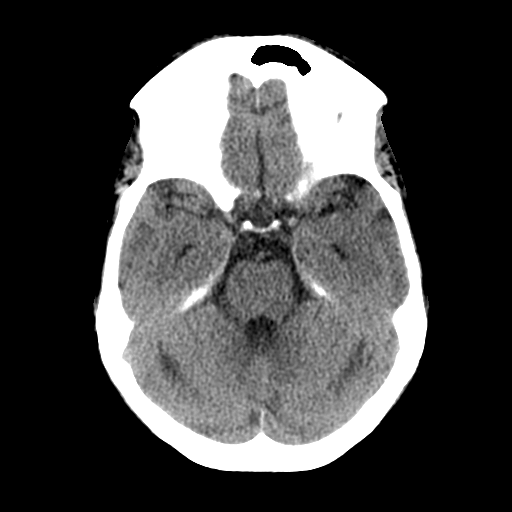
[im 11/30  brain]
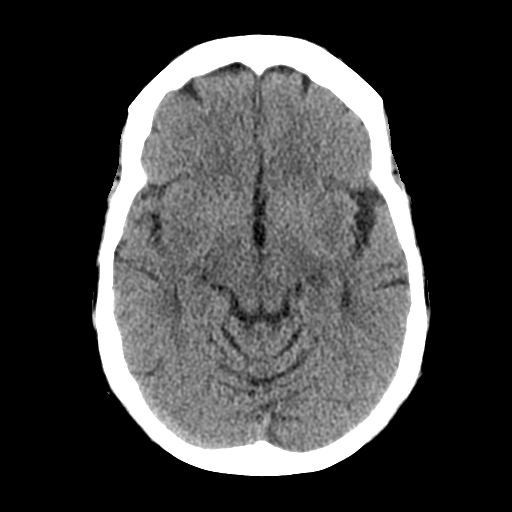
[im 14/30  brain]
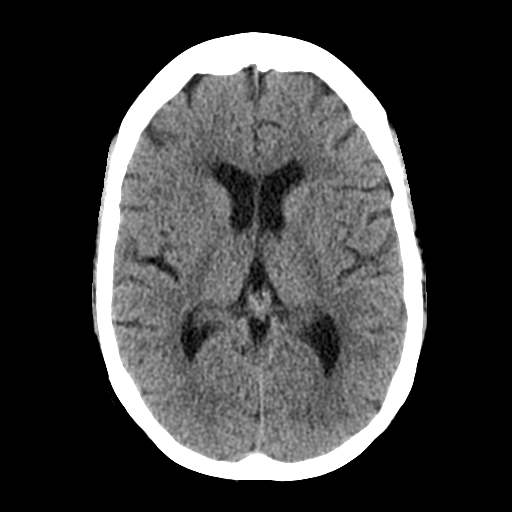
[im 14/30  bone]
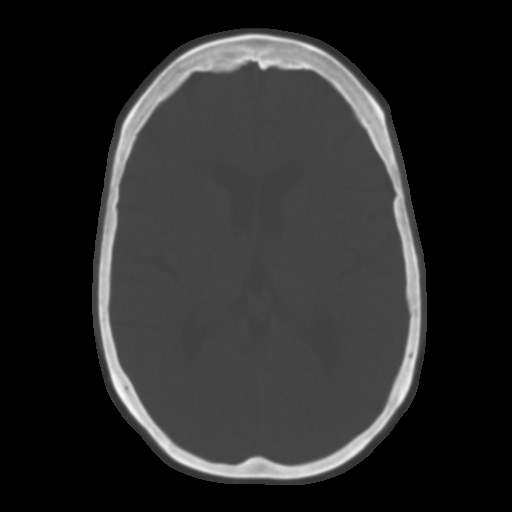
[im 17/30  brain]
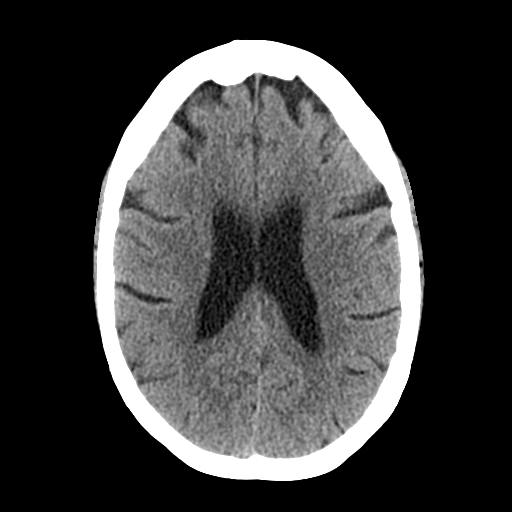
[im 20/30  brain]
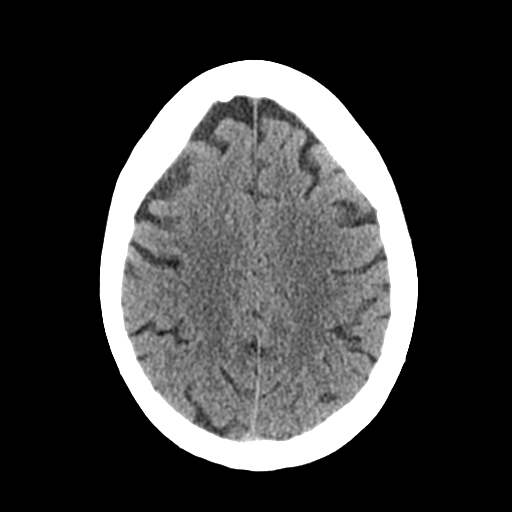
[im 23/30  brain]
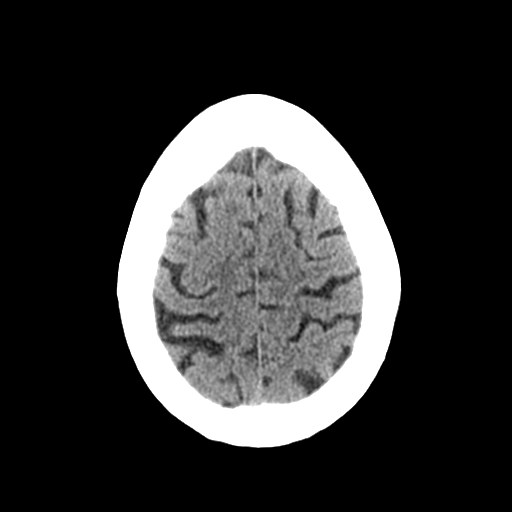
[im 25/30  brain]
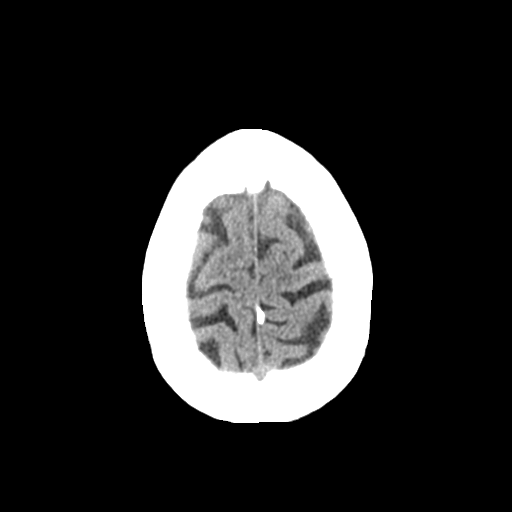
[im 25/30  bone]
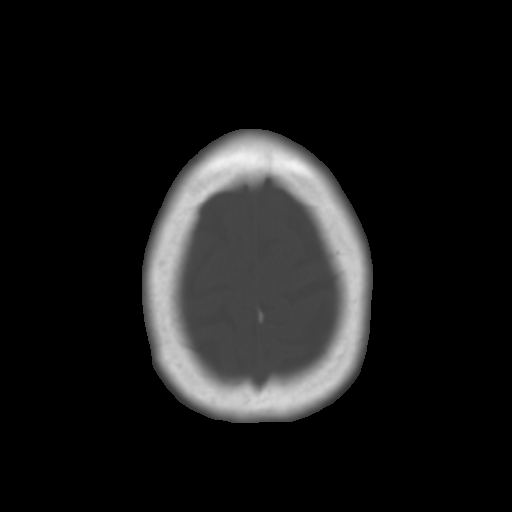
[im 28/30  brain]
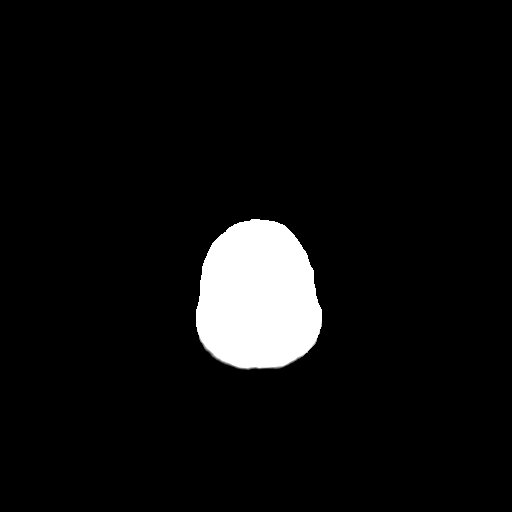

[Series 5: coronal soft tissue · coronal · 0.29mm/px · 3 of 64 slices shown]
[im 22/64  brain]
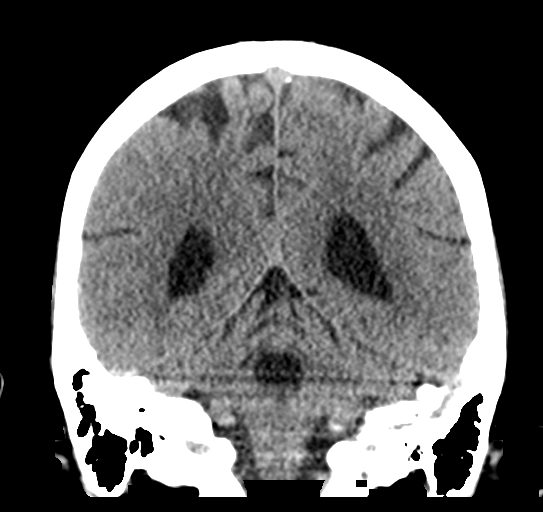
[im 29/64  brain]
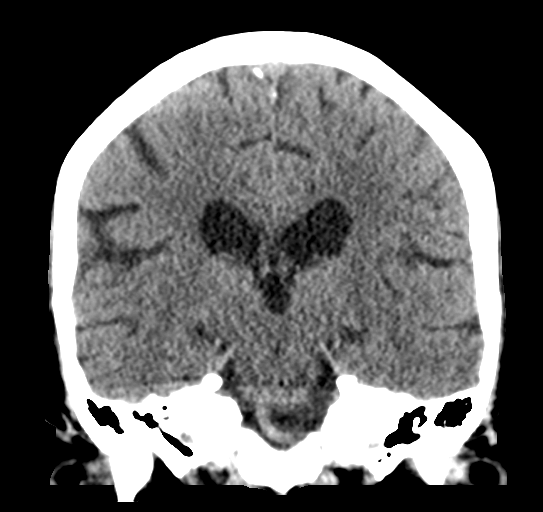
[im 36/64  brain]
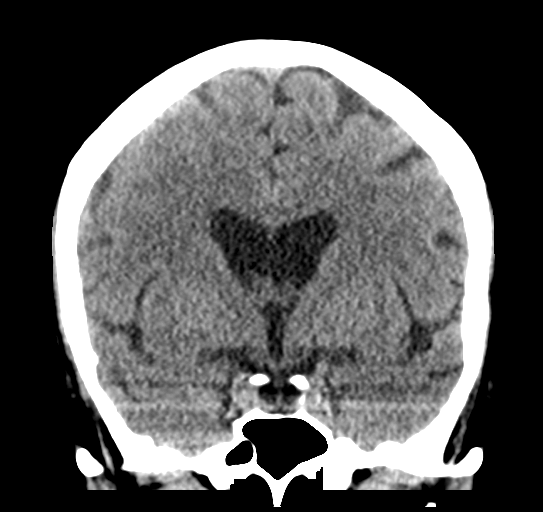

[Series 6: sagittal soft tissue · sagittal · 0.29mm/px · 3 of 54 slices shown]
[im 18/54  brain]
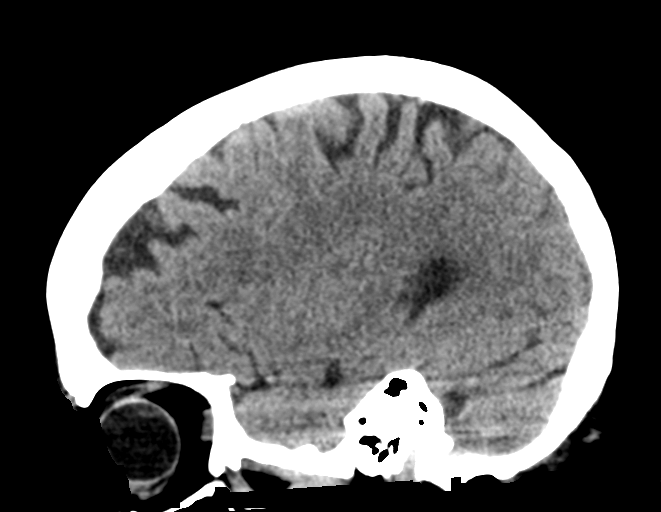
[im 27/54  brain]
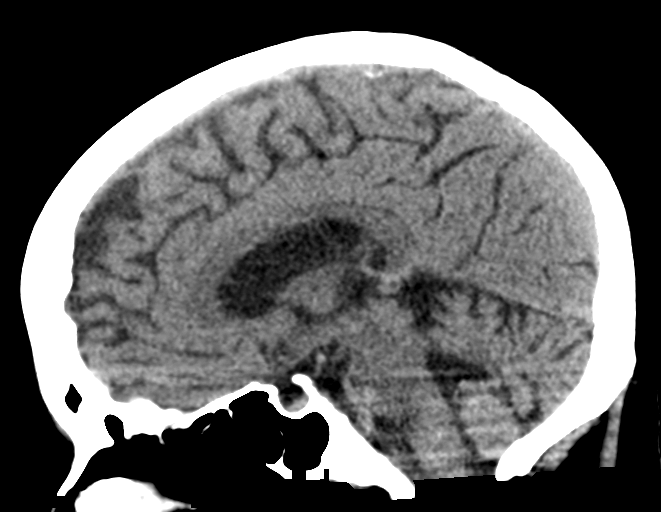
[im 36/54  brain]
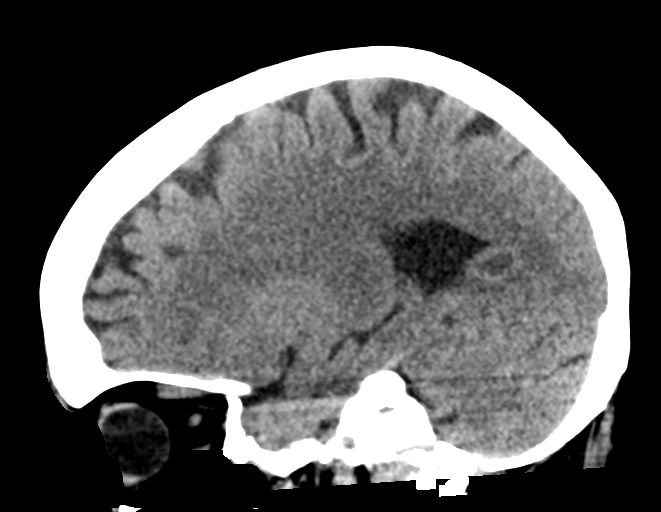

[16 of 47 positions shown; findings below may reference images not displayed]

FINDINGS: Brain: No evidence of acute infarction, hemorrhage, hydrocephalus,
extra-axial collection or mass lesion/mass effect.

Vascular: No hyperdense vessel or unexpected calcification.

Skull: Normal. Negative for fracture or focal lesion.

Sinuses/Orbits: No acute finding.

Other: None.
IMPRESSION: Normal head CT.

## 2020-11-03 ENCOUNTER — Emergency Department (HOSPITAL_COMMUNITY): Payer: Medicare Other

## 2020-11-03 ENCOUNTER — Emergency Department (HOSPITAL_COMMUNITY)
Admission: EM | Admit: 2020-11-03 | Discharge: 2020-11-03 | Disposition: A | Discharge status: Home / Self Care | Payer: Medicare Other | Attending: Emergency Medicine | Admitting: Emergency Medicine

## 2020-11-03 DIAGNOSIS — J45909 Unspecified asthma, uncomplicated: Secondary | ICD-10-CM | POA: Diagnosis not present

## 2020-11-03 DIAGNOSIS — R0789 Other chest pain: Secondary | ICD-10-CM | POA: Insufficient documentation

## 2020-11-03 DIAGNOSIS — Z91040 Latex allergy status: Secondary | ICD-10-CM | POA: Insufficient documentation

## 2020-11-03 DIAGNOSIS — R079 Chest pain, unspecified: Secondary | ICD-10-CM

## 2020-11-03 DIAGNOSIS — I2510 Atherosclerotic heart disease of native coronary artery without angina pectoris: Secondary | ICD-10-CM | POA: Diagnosis not present

## 2020-11-03 DIAGNOSIS — I10 Essential (primary) hypertension: Secondary | ICD-10-CM | POA: Diagnosis not present

## 2020-11-03 DIAGNOSIS — Z79899 Other long term (current) drug therapy: Secondary | ICD-10-CM | POA: Diagnosis not present

## 2020-11-03 DIAGNOSIS — E039 Hypothyroidism, unspecified: Secondary | ICD-10-CM | POA: Diagnosis not present

## 2020-11-03 LAB — CBC WITH DIFFERENTIAL/PLATELET
Abs Immature Granulocytes: 0.07 10*3/uL (ref 0.00–0.07)
Basophils Absolute: 0 10*3/uL (ref 0.0–0.1)
Basophils Relative: 1 %
Eosinophils Absolute: 0 10*3/uL (ref 0.0–0.5)
Eosinophils Relative: 1 %
HCT: 44 % (ref 36.0–46.0)
Hemoglobin: 14.1 g/dL (ref 12.0–15.0)
Immature Granulocytes: 1 %
Lymphocytes Relative: 16 %
Lymphs Abs: 1.4 10*3/uL (ref 0.7–4.0)
MCH: 27.5 pg (ref 26.0–34.0)
MCHC: 32 g/dL (ref 30.0–36.0)
MCV: 85.9 fL (ref 80.0–100.0)
Monocytes Absolute: 0.4 10*3/uL (ref 0.1–1.0)
Monocytes Relative: 5 %
Neutro Abs: 6.4 10*3/uL (ref 1.7–7.7)
Neutrophils Relative %: 76 %
Platelets: 192 10*3/uL (ref 150–400)
RBC: 5.12 MIL/uL — ABNORMAL HIGH (ref 3.87–5.11)
RDW: 12.7 % (ref 11.5–15.5)
WBC: 8.3 10*3/uL (ref 4.0–10.5)
nRBC: 0 % (ref 0.0–0.2)

## 2020-11-03 LAB — COMPREHENSIVE METABOLIC PANEL
ALT: 11 U/L (ref 0–44)
AST: 17 U/L (ref 15–41)
Albumin: 3.7 g/dL (ref 3.5–5.0)
Alkaline Phosphatase: 61 U/L (ref 38–126)
Anion gap: 8 (ref 5–15)
BUN: 14 mg/dL (ref 8–23)
CO2: 28 mmol/L (ref 22–32)
Calcium: 9.5 mg/dL (ref 8.9–10.3)
Chloride: 104 mmol/L (ref 98–111)
Creatinine, Ser: 1.34 mg/dL — ABNORMAL HIGH (ref 0.44–1.00)
GFR, Estimated: 43 mL/min — ABNORMAL LOW (ref >60)
Glucose, Bld: 89 mg/dL (ref 70–99)
Potassium: 3.7 mmol/L (ref 3.5–5.1)
Sodium: 140 mmol/L (ref 135–145)
Total Bilirubin: 0.8 mg/dL (ref 0.3–1.2)
Total Protein: 6.4 g/dL — ABNORMAL LOW (ref 6.5–8.1)

## 2020-11-03 LAB — TROPONIN I (HIGH SENSITIVITY)
Troponin I (High Sensitivity): 7 ng/L (ref <18)
Troponin I (High Sensitivity): 7 ng/L (ref <18)

## 2020-11-03 MED ORDER — PANTOPRAZOLE SODIUM 40 MG PO TBEC: 40.0000 mg | DELAYED_RELEASE_TABLET | Freq: Every day | ORAL | 0 refills | Status: DC

## 2020-11-03 NOTE — Discharge Instructions (Addendum)
The testing today does not show any serious problems with your heart.  We are going to prescribe a medication to help with decreasing acid production which may help.  You can also try taking an antacid such as Maalox or Tums, before meals and at bedtime to see if that helps.  Call your doctor for a follow-up appointment in a week or so to make sure you are getting better.

## 2020-11-03 NOTE — ED Provider Notes (Signed)
[facility] EMERGENCY DEPARTMENT Provider Note   CSN: [id number] Arrival date & time: 11/03/20  1615     History Chief Complaint  Patient presents with   Chest Pain    KEIGHLEY DECKMAN is a 70 y.o. female.  HPI She presents for evaluation of chest pain that started at 3 PM today.  It is described as a heavy sensation that is in the central lower chest and extends to her mid back, lasting for up to 10 minutes.  She states it definitely does not feel like prior episodes associated with heartburn from her hiatal hernia.  She is not currently taking a PPI.  She feels somewhat weak but is able to walk.  She came by EMS and did not receive treatment during transport.  She does not have a prior cardiac history and states that her cholesterol level is "147."  She has not had any diaphoresis today.  She denies cough, headache, neck pain or back pain.  She is a retired Nutritional therapist.  There are no other known active modifying factors.    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   Complex tear of lateral meniscus of left knee as current injury    Coronary artery disease    DDD (degenerative disc disease), cervical    DDD (degenerative disc disease), lumbar    Depression    DVT (deep venous thrombosis) (HCC)    multiple since age 70   Dysrhythmia    Fibromyalgia    GERD (gastroesophageal reflux disease)    H/O gastric bypass    only has 20%stomack-no nsaids-gi bleed   H/O hiatal hernia    Head injury    years ago-memory loss   Headache(784.0)    HNP (herniated nucleus pulposus)    multiple   HOH (hard of hearing)    Hx MRSA infection    Hypertension    Hypothyroidism    Lupus (HCC)    MI (myocardial infarction) (HCC)    due to clots   Myocardial infarction (HCC)     Scoliosis    Shortness of breath    Wears glasses     Patient Active Problem List   Diagnosis Date Noted   Hyperglycemia 06/11/2017   Chronic pain syndrome 06/11/2017   Insomnia 06/11/2017   Hx MRSA infection    Complex tear of lateral meniscus of left knee as current injury    Abscess    Lupus (HCC)    HNP (herniated nucleus pulposus)    DDD (degenerative disc disease), lumbar    Scoliosis    Head injury    H/O gastric bypass    DVT (deep venous thrombosis) (HCC)    HOH (hard of hearing)    Wears glasses    Blood dyscrasia    Fibromyalgia    Arthritis    Headache(784.0)    H/O hiatal hernia    Coronary artery disease    Hypertension    Dysrhythmia    Hypothyroidism    Depression    GERD (gastroesophageal reflux disease)     Past Surgical History:  Procedure Laterality Date   ABDOMINAL HYSTERECTOMY  1986   APPENDECTOMY  1964   CARDIAC CATHETERIZATION  2010   FOOT NEUROMA  SURGERY  1971   rt foot   GASTRIC BYPASS  2010   done at the [facility]   KNEE ARTHROSCOPY Left 08/12/2012   Procedure: ARTHROSCOPY KNEE, PARTIAL LATERAL MENISCECTOMY, CHONDROPLASTY;  Surgeon: Nilda Simmer, MD;  Location: [facility];  Service: Orthopedics;  Laterality: Left;     OB History   No obstetric history on file.     Family History  Problem Relation Age of Onset   Diabetes Other    Hypertension Other    Heart disease Other    Arthritis Other     Social History   Tobacco Use   Smoking status: Never   Smokeless tobacco: Never  Vaping Use   Vaping Use: Never used  Substance Use Topics   Alcohol use: No   Drug use: No    Home Medications Prior to Admission medications   Medication Sig Start Date End Date Taking? Authorizing Provider  albuterol (VENTOLIN HFA) 108 (90 Base) MCG/ACT inhaler Inhale 2 puffs into the lungs every 6 (six) hours as needed for wheezing or shortness of breath.   Yes [provider]  lansoprazole (PREVACID) 15 MG  capsule Take 30 mg by mouth at bedtime.   Yes [provider]  pantoprazole (PROTONIX) 40 MG tablet Take 1 tablet (40 mg total) by mouth daily. 11/03/20  Yes Mancel Bale, MD  traZODone (DESYREL) 100 MG tablet Take 2 tablets (200 mg total) by mouth at bedtime. Patient taking differently: Take 100 mg by mouth at bedtime. 08/26/17  Yes Ardith Dark, MD  cephALEXin (KEFLEX) 500 MG capsule Take 1 capsule (500 mg total) by mouth 4 (four) times daily. Patient not taking: No sig reported 01/08/19   Gwyneth Sprout, MD  cyclobenzaprine (FLEXERIL) 10 MG tablet Take 1 tablet (10 mg total) by mouth 3 (three) times daily as needed for muscle spasms. Patient not taking: No sig reported 08/26/17   Ardith Dark, MD  Eliquis DVT/PE Starter Pack [facility] STARTER PACK) 5 MG TABS Take as directed on package: start with two-5mg  tablets twice daily for 7 days. On day 8, switch to one-5mg  tablet twice daily. Patient not taking: No sig reported 08/01/18   Arthur Holms, MD  enoxaparin (LOVENOX) 80 MG/0.8ML injection Inject 0.8 mLs (80 mg total) into the skin every 12 (twelve) hours for 12 days. 07/26/18 08/07/18  Derwood Kaplan, MD  HYDROcodone-acetaminophen (NORCO) 5-325 MG tablet Take 2 tablets by mouth every 6 (six) hours as needed. Patient not taking: No sig reported 08/21/18   Pricilla Loveless, MD  lansoprazole (PREVACID) 30 MG capsule Take 1 capsule (30 mg total) by mouth 2 (two) times daily. Takes 2 twice a day Patient not taking: No sig reported 08/08/17   Ardith Dark, MD  lisinopril-hydrochlorothiazide (PRINZIDE,ZESTORETIC) 20-25 MG tablet Take 1 tablet by mouth daily. Patient not taking: No sig reported 08/08/17   Ardith Dark, MD  metoCLOPramide (REGLAN) 10 MG tablet Take 1 tablet (10 mg total) by mouth every 6 (six) hours as needed for nausea. Patient not taking: No sig reported 04/19/18   Doug Sou, MD  ondansetron (ZOFRAN) 8 MG tablet Take 1 tablet (8 mg total) by mouth every 8 (eight) hours  as needed for nausea or vomiting. Patient not taking: No sig reported 06/11/17   Ardith Dark, MD  rosuvastatin (CRESTOR) 10 MG tablet Take 1 tablet (10 mg total) by mouth 2 (two) times daily. Patient not taking: No sig reported 06/11/17   Ardith Dark,  MD  tamsulosin (FLOMAX) 0.4 MG CAPS capsule Take 2 capsules (0.8 mg total) by mouth at bedtime. Patient not taking: No sig reported 06/11/17   Ardith Dark, MD    Allergies    2-(ethylmercuriothio)benzoic acid; Antazoline; Antihistamines, chlorpheniramine-type; Beta adrenergic blockers; Diltiazem; Erythromycin; Ginseng; Latex; Piperazine; Rivaroxaban; Tetanus toxoid; Tetanus toxoids; Thallous chloride tl 201; Ace inhibitors; and Aspirin  Review of Systems   Review of Systems  All other systems reviewed and are negative.  Physical Exam Updated Vital Signs BP 108/83   Pulse (!) 50   Temp 98.2 F (36.8 C) (Oral)   Resp 18   SpO2 95%   Physical Exam Vitals and nursing note reviewed.  Constitutional:      Appearance: She is well-developed. She is obese. She is not ill-appearing.  HENT:     Head: Normocephalic and atraumatic.     Right Ear: External ear normal.     Left Ear: External ear normal.     Mouth/Throat:     Mouth: Mucous membranes are moist.     Pharynx: No oropharyngeal exudate or posterior oropharyngeal erythema.  Eyes:     Conjunctiva/sclera: Conjunctivae normal.     Pupils: Pupils are equal, round, and reactive to light.  Neck:     Trachea: Phonation normal.  Cardiovascular:     Rate and Rhythm: Normal rate and regular rhythm.     Heart sounds: Normal heart sounds.  Pulmonary:     Effort: Pulmonary effort is normal. No respiratory distress.     Breath sounds: Normal breath sounds. No stridor.  Abdominal:     General: There is no distension.     Palpations: Abdomen is soft.     Tenderness: There is no abdominal tenderness.  Musculoskeletal:        General: No swelling or tenderness. Normal range of motion.      Cervical back: Normal range of motion and neck supple.     Right lower leg: No edema.     Left lower leg: No edema.  Skin:    General: Skin is warm and dry.  Neurological:     Mental Status: She is alert and oriented to person, place, and time.     Cranial Nerves: No cranial nerve deficit.     Sensory: No sensory deficit.     Motor: No abnormal muscle tone.     Coordination: Coordination normal.  Psychiatric:        Mood and Affect: Mood normal.        Behavior: Behavior normal.        Thought Content: Thought content normal.        Judgment: Judgment normal.    ED Results / Procedures / Treatments   Labs (all labs ordered are listed, but only abnormal results are displayed) Labs Reviewed  COMPREHENSIVE METABOLIC PANEL - Abnormal; Notable for the following components:      Result Value   Creatinine, Ser 1.34 (*)    Total Protein 6.4 (*)    GFR, Estimated 43 (*)    All other components within normal limits  CBC WITH DIFFERENTIAL/PLATELET - Abnormal; Notable for the following components:   RBC 5.12 (*)    All other components within normal limits  TROPONIN I (HIGH SENSITIVITY)  TROPONIN I (HIGH SENSITIVITY)    EKG EKG Interpretation  Date/Time:  Thursday November 03 2020 16:26:34 EDT Ventricular Rate:  53 PR Interval:  58 QRS Duration: 116 QT Interval:  476 QTC Calculation: 447 R Axis:   -  37 Text Interpretation: Sinus rhythm Short PR interval LVH with IVCD, LAD and secondary repol abnrm since last tracing no significant change Confirmed by Mancel BaleWentz, Nazaire Cordial [phone]) on 11/03/2020 4:30:08 PM  Radiology DG Chest Port 1 View  Result Date: 11/03/2020 CLINICAL DATA:  Chest pain EXAM: PORTABLE CHEST 1 VIEW COMPARISON:  06/17/2019 chest radiograph. FINDINGS: Partially visualized surgical hardware from ACDF in the lower cervical spine. Stable cardiomediastinal silhouette with normal heart size. No pneumothorax. No pleural effusion. Lungs appear clear, with no acute consolidative  airspace disease and no pulmonary edema. IMPRESSION: No active disease. Electronically Signed   By: Delbert PhenixJason A Poff M.D.   On: 11/03/2020 17:06    Procedures Procedures   Medications Ordered in ED Medications - No data to display  ED Course  I have reviewed the triage vital signs and the nursing notes.  Pertinent labs & imaging results that were available during my care of the patient were reviewed by me and considered in my medical decision making (see chart for details).    MDM Rules/Calculators/A&P                            Patient Vitals for the past 24 hrs:  BP Temp Temp src Pulse Resp SpO2  11/03/20 1815 108/83 -- -- (!) 50 18 95 %  11/03/20 1715 (!) 129/58 -- -- (!) 49 18 92 %  11/03/20 1626 (!) 146/65 98.2 F (36.8 C) Oral (!) 52 16 96 %    8:54 PM Reevaluation with update and discussion. After initial assessment and treatment, an updated evaluation reveals no additional complaints she appears comfortable, findings discussed with patient and her husband was in the room at this time.  All questions and. Mancel BaleElliott Theresa Dohrman   Medical Decision Making:  This patient is presenting for evaluation of chest pain, which does require a range of treatment options, and is a complaint that involves a moderate risk of morbidity and mortality. The differential diagnoses include ACS, PE, pneumonia, chest wall pain, gastritis. I decided to review old records, and in summary Ehly female, without prior cardiac history presenting with episodic intermittent chest pain, onset about 1 and half hours ago.  She has history of hiatal hernia and multiple other medical problems including: Hypertension, spine disorder, hypothyroidism and GERD.  EMR problem list includes diagnosis of coronary artery disease treated with Crestor..  I did not require additional historical information from anyone.  Clinical Laboratory Tests Ordered, included CBC, Metabolic panel, and troponin . Review indicates normal findings  except creatinine slightly elevated total protein level.. Radiologic Tests Ordered, included chest x-ray.  I independently Visualized: Radiograph images, which show no infiltrate or edema  Cardiac Monitor Tracing which shows normal sinus rhythm     Critical Interventions-clinical evaluation, laboratory testing, radiography, EKG, cardiac monitor, delta troponin, observation reassess  After These Interventions, the Patient was reevaluated and was found stable for discharge.  Patient symptoms are likely related to her hiatal hernia.  Doubt ACS, PE or pneumonia.  We will give her prescription for PPI to take and refer her back to her PCP.  CRITICAL CARE-no Performed by: Mancel BaleElliott Brecken Dewoody  Nursing Notes Reviewed/ Care Coordinated Applicable Imaging Reviewed Interpretation of Laboratory Data incorporated into ED treatment  The patient appears reasonably screened and/or stabilized for discharge and I doubt any other medical condition or other [facility] requiring further screening, evaluation, or treatment in the ED at this time prior to discharge.  Plan: Home Medications-continue  usual; Home Treatments-rest, fluid; return here if the recommended treatment, does not improve the symptoms; Recommended follow up-PCP, as needed and 1 week for checkup.     Final Clinical Impression(s) / ED Diagnoses Final diagnoses:  Nonspecific chest pain    Rx / DC Orders ED Discharge Orders          Ordered    pantoprazole (PROTONIX) 40 MG tablet  Daily        11/03/20 2053             Mancel Bale, MD 11/03/20 2055

## 2020-11-03 NOTE — ED Triage Notes (Signed)
Pt arrives to ED BIB [facility] due to CP that started this afternoon. Pt states pain is in the center of her chest and radiates to her back. 324mg  aspirin and 2 sublingual nitro was administered by EMS. Per EMS pt has Hx of DVT and PE but has not been taking her Blood thinners in over a year.  BP 140/70 HR 70 R 16

## 2022-01-18 ENCOUNTER — Ambulatory Visit: Payer: Medicare Other | Attending: Cardiology | Admitting: Cardiology

## 2022-01-18 ENCOUNTER — Encounter: Payer: Self-pay | Admitting: Cardiology

## 2022-01-18 VITALS — BP 110/70 | HR 52 | Ht 65.0 in | Wt 143.0 lb

## 2022-01-18 DIAGNOSIS — R072 Precordial pain: Secondary | ICD-10-CM | POA: Diagnosis present

## 2022-01-18 DIAGNOSIS — R9431 Abnormal electrocardiogram [ECG] [EKG]: Secondary | ICD-10-CM | POA: Insufficient documentation

## 2022-01-18 NOTE — Patient Instructions (Signed)
Medication Instructions:  The current medical regimen is effective;  continue present plan and medications.  *If you need a refill on your cardiac medications before your next appointment, please call your pharmacy*  Lab Work: None needed today ([place] 01/11/22 - 1.33) If you have labs (blood work) drawn today and your tests are completely normal, you will receive your results only by: [place] (if you have MyChart) OR A paper copy in the mail If you have any lab test that is abnormal or we need to change your treatment, we will call you to review the results.  Testing/Procedures:  Your physician has requested that you have an echocardiogram. Echocardiography is a painless test that uses sound waves to create images of your heart. It provides your doctor with information about the size and shape of your heart and how well your heart's chambers and valves are working. This procedure takes approximately one hour. There are no restrictions for this procedure.    Your cardiac CT will be scheduled at:   [facility] [address] [place], [place] [address] [phone]  Please arrive at the [facility] (Entrance C2) of [facility] 30 minutes prior to test start time. You can use the FREE valet parking offered at entrance C (encouraged to control the heart rate for the test)  Proceed to the [facility] Radiology Department (first floor) to check-in and test prep.  All radiology patients and guests should use entrance C2 at [facility], accessed from [facility], even though the hospital's physical address listed is [address].    Please follow these instructions carefully (unless otherwise directed):  On the Night Before the Test: Be sure to Drink plenty of water. Do not consume any caffeinated/decaffeinated beverages or chocolate 12 hours prior to your test. Do not take any antihistamines 12 hours prior to  your test.  On the Day of the Test: Drink plenty of water until 1 hour prior to the test. Do not eat any food 1 hour prior to test. You may take your regular medications prior to the test.  Do not take Lisinopril this AM. HOLD Furosemide/Hydrochlorothiazide morning of the test. FEMALES- please wear underwire-free bra if available, avoid dresses & tight clothing  After the Test: Drink plenty of water. After receiving IV contrast, you may experience a mild flushed feeling. This is normal. On occasion, you may experience a mild rash up to 24 hours after the test. This is not dangerous. If this occurs, you can take Benadryl 25 mg and increase your fluid intake. If you experience trouble breathing, this can be serious. If it is severe call 911 IMMEDIATELY. If it is mild, please call our office.  We will call to schedule your test 2-4 weeks out understanding that some insurance companies will need an authorization prior to the service being performed.   For non-scheduling related questions, please contact the cardiac imaging nurse navigator should you have any questions/concerns: Marchia Bond, Cardiac Imaging Nurse Navigator Gordy Clement, Cardiac Imaging Nurse Navigator [facility] Direct Office Dial: [phone]   For scheduling needs, including cancellations and rescheduling, please call [place], [phone].   Follow-Up: At [facility], you and your health needs are our priority.  As part of our continuing mission to provide you with exceptional heart care, we have created designated Provider Care Teams.  These Care Teams include your primary Cardiologist (physician) and Advanced Practice Providers (APPs -  Physician Assistants and Nurse Practitioners) who all work together to provide you with the care you need, when you need it.  We recommend signing up for the patient portal called "MyChart".  Sign up information is provided on this After Visit  Summary.  MyChart is used to connect with patients for Virtual Visits (Telemedicine).  Patients are able to view lab/test results, encounter notes, upcoming appointments, etc.  Non-urgent messages can be sent to your provider as well.   To learn more about what you can do with MyChart, go to [email or web address].    Your next appointment:   Follow up will be based on the results of the above testing.  Important Information About Sugar

## 2022-01-18 NOTE — Progress Notes (Signed)
Cardiology Office Note:    Date:  01/18/2022   ID:  Melanie, Jarvis [birth year], MRN [id number]  PCP:  Vivien Presto, MD   [facility] Providers Cardiologist:  None     Referring MD: Lauretta Grill, FNP   History of Present Illness:    Melanie Jarvis is a 71 y.o. female here for the evaluation of chest pain at the request of Lauretta Grill, FNP.   She was last seen by Lauretta Grill, FNP on 01/10/2022 for palpitations. She has a prior history of hypertension, hyperlipidemia, and a past myocardial infarction. For the last couple of months she has recurrent reports of anterior chest discomfort that radiates to her left shoulder while at rest. EKG on 01/10/2022 showed poor R wave progression along precordial leads, evidence of prior inferior infarction with nonspecific ST changes in lateral leads. Due to worsening angina over a few days and abnormal EKG she was referred for urgent cardiac catheterization and recommended to present to [facility] She presented to the [facility] ED 01/11/2022 but left AMA prior to completing a heart catheterization.  Prior DVT in 07/2016. Had 3 episodes of blood clots within the prior 3 months despite treatment with lovenox.  Prior MI on 07/30/2016.  Today, she is accompanied by her husband. She complains of chest pain and extreme fatigue that began months ago. Her chest pain occurs every evening even while at rest or sitting down. Also, she describes her heart as "not feeling right" and it feeling "slow." This feeling of slowness occurs independently of activity; she may be sitting when it occurs.  Her fatigue is persistent throughout the whole day with little exertion (when she wakes up or walking around the house). She became fatigued from walking into the office today.  At a past visit she was told there was evidence of a past heart attack, with no indication of when the heart attack happened. During this time, she was experiencing  neck and back pain.   She denies any shortness of breath, or peripheral edema. No lightheadedness, headaches, syncope, orthopnea, or PND.  Of note, she had prior bariatric surgery and successfully lost 100 lbs. She was able to discontinue multiple medications due to her weight loss and resolution of diabetes.  She retired from working as a Microbiologist.    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   Complex tear of lateral meniscus of left knee as current injury    Coronary artery disease    DDD (degenerative disc disease), cervical    DDD (degenerative disc disease), lumbar    Depression    DVT (deep venous thrombosis) (HCC)    multiple since age 58   Dysrhythmia    Fibromyalgia    GERD (gastroesophageal reflux disease)    H/O gastric bypass    only has 20%stomack-no nsaids-gi bleed   H/O hiatal hernia    Head injury    years ago-memory loss   Headache(784.0)    HNP (herniated nucleus pulposus)    multiple   HOH (hard of hearing)    Hx MRSA infection    Hypertension    Hypothyroidism    Lupus (HCC)    MI (myocardial infarction) (HCC)  due to clots   Myocardial infarction ([place])    Scoliosis    Shortness of breath    Wears glasses     Past Surgical History:  Procedure Laterality Date   ABDOMINAL HYSTERECTOMY  [place]  2010   [place]   rt foot   GASTRIC BYPASS  2010   done at the [facility] ARTHROSCOPY Left 08/12/2012   Procedure: ARTHROSCOPY KNEE, PARTIAL LATERAL MENISCECTOMY, CHONDROPLASTY;  Surgeon: Lorn Junes, MD;  Location: [facility];  Service: Orthopedics;  Laterality: Left;    Current Medications: Current Meds  Medication Sig   ALPRAZolam (XANAX)  0.5 MG tablet Take 0.5 mg by mouth 3 (three) times daily as needed.   benazepril (LOTENSIN) 20 MG tablet Take 20 mg by mouth daily.   lansoprazole (PREVACID) 15 MG capsule Take 15 mg by mouth 2 (two) times daily.   traZODone (DESYREL) 100 MG tablet Take 100 mg by mouth at bedtime.     Allergies:   2-(ethylmercuriothio)benzoic acid; Antazoline; Antihistamines, chlorpheniramine-type; Beta adrenergic blockers; Diltiazem; Erythromycin; Ginseng; Latex; Piperazine; Rivaroxaban; Tetanus toxoid; Tetanus toxoids; Thallous chloride tl 201; Ace inhibitors; and Aspirin   Social History   Socioeconomic History   Marital status: Married    Spouse name: Not on file   Number of children: Not on file   Years of education: Not on file   Highest education level: Not on file  Occupational History   Not on file  Tobacco Use   Smoking status: Never   Smokeless tobacco: Never  Vaping Use   Vaping Use: Never used  Substance and Sexual Activity   Alcohol use: No   Drug use: No   Sexual activity: Not on file  Other Topics Concern   Not on file  Social History Narrative   Not on file   Social Determinants of Health   Financial Resource Strain: Not on file  Food Insecurity: Not on file  Transportation Needs: Not on file  Physical Activity: Not on file  Stress: Not on file  Social Connections: Not on file     Family History: The patient's family history includes Arthritis in an other family member; Diabetes in an other family member; Heart disease in an other family member; Hypertension in an other family member.  ROS:   Please see the history of present illness.    (+) Chest pain  (+) Fatigue/Malaise All other systems reviewed and are negative.  EKGs/Labs/Other Studies Reviewed:    The following studies were reviewed today:  Left LE Venous Doppler  07/26/2018: Summary:  Right: No evidence of common femoral vein obstruction.  Left: Findings consistent with chronic deep vein thrombosis  involving the  left popliteal vein. No cystic structure found in the popliteal fossa. No  evidence of acute DVT.   EKG:  EKG is personally reviewed and interpreted. 01/18/2022: Sinus bradycardia. Rate 52 bpm.  Recent Labs: No results found for requested labs within last 365 days.   Recent Lipid Panel No results found for: "CHOL", "TRIG", "HDL", "CHOLHDL", "VLDL", "LDLCALC", "LDLDIRECT"   Risk Assessment/Calculations:          Physical Exam:    VS:  BP 110/70 (BP Location: Left Arm, Patient Position: Sitting, Cuff Size: Normal)   Pulse (!) 52   Ht 5\' 5"  (1.651 m)   Wt 143 lb (64.9 kg)   SpO2 97%   BMI 23.80 kg/m  Wt Readings from Last 3 Encounters:  01/18/22 143 lb (64.9 kg)  06/17/19 140 lb (63.5 kg)  08/01/18 152 lb 1.9 oz (69 kg)     GEN: Well nourished, well developed in no acute distress HEENT: Normal NECK: No JVD; No carotid bruits LYMPHATICS: No lymphadenopathy CARDIAC: bradycardic, no murmurs, rubs, gallops; 1+ right radial pulse. RESPIRATORY:  Clear to auscultation without rales, wheezing or rhonchi  ABDOMEN: Soft, non-tender, non-distended MUSCULOSKELETAL:  No edema; No deformity  SKIN: Warm and dry NEUROLOGIC:  Alert and oriented x 3 PSYCHIATRIC:  Normal affect    ASSESSMENT:    1. Precordial pain   2. Abnormal EKG    PLAN:    In order of problems listed above:  EKG demonstrates inferior infarct pattern as well as poor R wave progression suggestive of anterior infarct pattern.  We will go ahead and check echocardiogram to evaluate wall motion.  We will also check a coronary CT scan.  I think this would be effective given her slow steady heart rate.  Prior troponin values have been normal.  Obviously if coronary CT demonstrates severe abnormalities, she may warrant left heart catheterization.  We will pursue further therapies based upon interpretation of scan.  As part of further work-up, we may wish to place a ZIO monitor to monitor her for  chronotropic competence.  She states that her heart feels slow most of the time.      Follow-up: PRN. We will follow up with the results after testing.   Medication Adjustments/Labs and Tests Ordered: Current medicines are reviewed at length with the patient today.  Concerns regarding medicines are outlined above.   Orders Placed This Encounter  Procedures   CT CORONARY MORPH W/CTA COR W/SCORE W/CA W/CM &/OR WO/CM   EKG 12-Lead   ECHOCARDIOGRAM COMPLETE   No orders of the defined types were placed in this encounter.  Patient Instructions  Medication Instructions:  The current medical regimen is effective;  continue present plan and medications.  *If you need a refill on your cardiac medications before your next appointment, please call your pharmacy*  Lab Work: None needed today ([place] 01/11/22 - 1.33) If you have labs (blood work) drawn today and your tests are completely normal, you will receive your results only by: MyChart Message (if you have MyChart) OR A paper copy in the mail If you have any lab test that is abnormal or we need to change your treatment, we will call you to review the results.  Testing/Procedures:  Your physician has requested that you have an echocardiogram. Echocardiography is a painless test that uses sound waves to create images of your heart. It provides your doctor with information about the size and shape of your heart and how well your heart's chambers and valves are working. This procedure takes approximately one hour. There are no restrictions for this procedure.    Your cardiac CT will be scheduled at:   [facility] [address] [place], [place] [address] [phone]  Please arrive at the [facility] (Entrance C2) of [facility] 30 minutes prior to test start time. You can use the FREE valet parking offered at entrance C (encouraged to control the heart rate for the test)  Proceed to the [facility] Radiology Department (first floor) to check-in and test prep.  All radiology patients and guests should use entrance C2 at [facility], accessed from [facility], even though the hospital's physical address listed  is [address].    Please follow these instructions carefully (unless otherwise directed):  On the Night Before the Test: Be sure to Drink plenty of water. Do not consume any caffeinated/decaffeinated beverages or chocolate 12 hours prior to your test. Do not take any antihistamines 12 hours prior to your test.  On the Day of the Test: Drink plenty of water until 1 hour prior to the test. Do not eat any food 1 hour prior to test. You may take your regular medications prior to the test.  Do not take Lisinopril this AM. HOLD Furosemide/Hydrochlorothiazide morning of the test. FEMALES- please wear underwire-free bra if available, avoid dresses & tight clothing  After the Test: Drink plenty of water. After receiving IV contrast, you may experience a mild flushed feeling. This is normal. On occasion, you may experience a mild rash up to 24 hours after the test. This is not dangerous. If this occurs, you can take Benadryl 25 mg and increase your fluid intake. If you experience trouble breathing, this can be serious. If it is severe call 911 IMMEDIATELY. If it is mild, please call our office.  We will call to schedule your test 2-4 weeks out understanding that some insurance companies will need an authorization prior to the service being performed.   For non-scheduling related questions, please contact the cardiac imaging nurse navigator should you have any questions/concerns: Rockwell [place], Cardiac Imaging Nurse Navigator Larey Brick, Cardiac Imaging Nurse Navigator [facility] Direct Office Dial: [phone]   For scheduling needs, including cancellations and rescheduling, please call [place],  [phone].   Follow-Up: At [facility], you and your health needs are our priority.  As part of our continuing mission to provide you with exceptional heart care, we have created designated Provider Care Teams.  These Care Teams include your primary Cardiologist (physician) and Advanced Practice Providers (APPs -  Physician Assistants and Nurse Practitioners) who all work together to provide you with the care you need, when you need it.  We recommend signing up for the patient portal called "MyChart".  Sign up information is provided on this After Visit Summary.  MyChart is used to connect with patients for Virtual Visits (Telemedicine).  Patients are able to view lab/test results, encounter notes, upcoming appointments, etc.  Non-urgent messages can be sent to your provider as well.   To learn more about what you can do with MyChart, go to [email or web address].    Your next appointment:   Follow up will be based on the results of the above testing.  Important Information About Sugar         I,Rachel Rivera,acting as a scribe for Donato Schultz, MD.,have documented all relevant documentation on the behalf of Donato Schultz, MD,as directed by  Donato Schultz, MD while in the presence of Donato Schultz, MD.  I, Donato Schultz, MD, have reviewed all documentation for this visit. The documentation on 01/18/22 for the exam, diagnosis, procedures, and orders are all accurate and complete.   Signed, Donato Schultz, MD  01/18/2022 4:49 PM    [facility]

## 2022-02-02 ENCOUNTER — Ambulatory Visit (HOSPITAL_COMMUNITY): Payer: 59 | Attending: Cardiology

## 2022-02-02 DIAGNOSIS — R9431 Abnormal electrocardiogram [ECG] [EKG]: Secondary | ICD-10-CM

## 2022-02-02 DIAGNOSIS — R072 Precordial pain: Secondary | ICD-10-CM

## 2022-02-02 LAB — ECHOCARDIOGRAM COMPLETE
Area-P 1/2: 3.4 cm2
P 1/2 time: 648 msec
S' Lateral: 2.9 cm

## 2022-02-05 ENCOUNTER — Encounter: Payer: Self-pay | Admitting: *Deleted

## 2022-02-13 ENCOUNTER — Telehealth (HOSPITAL_COMMUNITY): Payer: Self-pay | Admitting: Emergency Medicine

## 2022-02-13 NOTE — Telephone Encounter (Signed)
Reaching out to patient to offer assistance regarding upcoming cardiac imaging study; pt verbalizes understanding of appt date/time, parking situation and where to check in, pre-test NPO status and medications ordered, and verified current allergies; name and call back number provided for further questions should they arise Marchia Bond RN Navigator Cardiac Imaging [facility] [phone] office [phone] cell  Arrival 300, WC entrance Denies iv issues  Daily meds Holding ace inhibitor

## 2022-02-13 NOTE — Telephone Encounter (Signed)
Attempted to call patient regarding upcoming cardiac CT appointment. °Left message on voicemail with name and callback number °Nashaun Hillmer RN Navigator Cardiac Imaging °[facility] °[phone] Office °[phone] Cell ° °

## 2022-02-14 ENCOUNTER — Ambulatory Visit (HOSPITAL_COMMUNITY)
Admission: RE | Admit: 2022-02-14 | Discharge: 2022-02-14 | Disposition: A | Discharge status: Home / Self Care | Payer: 59 | Source: Ambulatory Visit | Attending: Cardiology | Admitting: Cardiology

## 2022-02-14 DIAGNOSIS — R072 Precordial pain: Secondary | ICD-10-CM | POA: Diagnosis not present

## 2022-02-14 MED ORDER — IOHEXOL 350 MG/ML SOLN
100.0000 mL | Freq: Once | INTRAVENOUS | Status: AC | PRN
  Administered 2022-02-14: 100 mL via INTRAVENOUS

## 2022-02-14 MED ORDER — NITROGLYCERIN 0.4 MG SL SUBL
SUBLINGUAL_TABLET | SUBLINGUAL | Status: AC
  Filled 2022-02-14: qty 2

## 2022-02-14 MED ORDER — NITROGLYCERIN 0.4 MG SL SUBL
0.8000 mg | SUBLINGUAL_TABLET | Freq: Once | SUBLINGUAL | Status: AC
  Administered 2022-02-14: 0.8 mg via SUBLINGUAL

## 2022-06-13 ENCOUNTER — Ambulatory Visit: Payer: Medicare Other | Admitting: Podiatry
